# Patient Record
Sex: Female | Born: 1975 | Race: White | Hispanic: No | Marital: Married | Smoking: Never smoker
Health system: Southern US, Community
[De-identification: ages and names within clinical notes are randomized; demographics above are authoritative.]

## PROBLEM LIST (undated history)

## (undated) DIAGNOSIS — N289 Disorder of kidney and ureter, unspecified: Secondary | ICD-10-CM

## (undated) DIAGNOSIS — F329 Major depressive disorder, single episode, unspecified: Secondary | ICD-10-CM

## (undated) DIAGNOSIS — Z87442 Personal history of urinary calculi: Secondary | ICD-10-CM

## (undated) DIAGNOSIS — N201 Calculus of ureter: Secondary | ICD-10-CM

## (undated) DIAGNOSIS — Z8719 Personal history of other diseases of the digestive system: Secondary | ICD-10-CM

## (undated) DIAGNOSIS — G4733 Obstructive sleep apnea (adult) (pediatric): Secondary | ICD-10-CM

## (undated) DIAGNOSIS — N200 Calculus of kidney: Secondary | ICD-10-CM

## (undated) DIAGNOSIS — G540 Brachial plexus disorders: Secondary | ICD-10-CM

## (undated) DIAGNOSIS — I82409 Acute embolism and thrombosis of unspecified deep veins of unspecified lower extremity: Secondary | ICD-10-CM

## (undated) DIAGNOSIS — F411 Generalized anxiety disorder: Secondary | ICD-10-CM

## (undated) DIAGNOSIS — F32A Depression, unspecified: Secondary | ICD-10-CM

## (undated) HISTORY — PX: TUBAL LIGATION: SHX77

## (undated) HISTORY — PX: BUNIONECTOMY: SHX129

## (undated) HISTORY — PX: SPINAL FUSION: SHX223

## (undated) HISTORY — PX: BACK SURGERY: SHX140

## (undated) HISTORY — PX: CHOLECYSTECTOMY: SHX55

---
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
## 2002-04-23 HISTORY — PX: TUBAL LIGATION: SHX77

## 2004-04-23 HISTORY — PX: NASAL SEPTUM SURGERY: SHX37

## 2007-12-24 DIAGNOSIS — R002 Palpitations: Secondary | ICD-10-CM | POA: Insufficient documentation

## 2007-12-24 DIAGNOSIS — R079 Chest pain, unspecified: Secondary | ICD-10-CM | POA: Insufficient documentation

## 2007-12-24 DIAGNOSIS — R011 Cardiac murmur, unspecified: Secondary | ICD-10-CM | POA: Insufficient documentation

## 2011-04-24 HISTORY — PX: URETEROSCOPY WITH HOLMIUM LASER LITHOTRIPSY: SHX6645

## 2015-07-23 HISTORY — PX: LAPAROSCOPIC CHOLECYSTECTOMY: SUR755

## 2015-10-19 DIAGNOSIS — G4733 Obstructive sleep apnea (adult) (pediatric): Secondary | ICD-10-CM | POA: Insufficient documentation

## 2015-11-22 DIAGNOSIS — Z86718 Personal history of other venous thrombosis and embolism: Secondary | ICD-10-CM

## 2015-11-22 HISTORY — DX: Personal history of other venous thrombosis and embolism: Z86.718

## 2015-12-08 DIAGNOSIS — Q66212 Congenital metatarsus primus varus, left foot: Secondary | ICD-10-CM | POA: Insufficient documentation

## 2015-12-08 HISTORY — PX: BUNIONECTOMY: SHX129

## 2016-10-05 HISTORY — PX: LUMBAR DISC SURGERY: SHX700

## 2017-04-04 DIAGNOSIS — N200 Calculus of kidney: Secondary | ICD-10-CM | POA: Insufficient documentation

## 2017-07-04 DIAGNOSIS — G43109 Migraine with aura, not intractable, without status migrainosus: Secondary | ICD-10-CM | POA: Insufficient documentation

## 2017-11-16 DIAGNOSIS — G8929 Other chronic pain: Secondary | ICD-10-CM | POA: Insufficient documentation

## 2017-12-04 DIAGNOSIS — Z981 Arthrodesis status: Secondary | ICD-10-CM | POA: Insufficient documentation

## 2018-01-24 HISTORY — PX: LUMBAR FUSION: SHX111

## 2018-10-15 DIAGNOSIS — F411 Generalized anxiety disorder: Secondary | ICD-10-CM | POA: Insufficient documentation

## 2018-10-15 DIAGNOSIS — F331 Major depressive disorder, recurrent, moderate: Secondary | ICD-10-CM | POA: Insufficient documentation

## 2018-10-31 ENCOUNTER — Emergency Department
Admission: EM | Admit: 2018-10-31 | Discharge: 2018-10-31 | Disposition: A | Discharge status: Home / Self Care | Payer: Managed Care, Other (non HMO) | Source: Home / Self Care

## 2018-10-31 ENCOUNTER — Other Ambulatory Visit: Payer: Self-pay

## 2018-10-31 DIAGNOSIS — R0982 Postnasal drip: Secondary | ICD-10-CM

## 2018-10-31 DIAGNOSIS — R0981 Nasal congestion: Secondary | ICD-10-CM | POA: Diagnosis not present

## 2018-10-31 DIAGNOSIS — H9201 Otalgia, right ear: Secondary | ICD-10-CM

## 2018-10-31 MED ORDER — IPRATROPIUM BROMIDE 0.06 % NA SOLN: 2.0000 | Freq: Four times a day (QID) | NASAL | 1 refills | Status: DC

## 2018-10-31 NOTE — ED Triage Notes (Signed)
Pt states that nasal congestion and scratchy throat started Monday, ear ache started Wednesday.  Low grade fever.

## 2018-10-31 NOTE — ED Provider Notes (Signed)
[facility]    CSN: [id number] Arrival date & time: 10/31/18  [id number]     History   Chief Complaint Chief Complaint  Patient presents with  . Otalgia  . Nasal Congestion  . Cough  . Sore Throat    HPI Beverly Ford is a 43 y.o. female.   HPI Beverly Ford is a 43 y.o. female presenting to UC with c/o nasal congestion and scratchy throat that started 5 days ago, associated Right ear pain/fullness that started 2 days ago. She did go to a chiropractor yesterday for a "release" which provided moderate relief but still having some symptoms. She has tried OTC mucinex and allergy medication w/o relief.  She does not take allergy medication daily, only when symptomatic. Denies fever, chills, n/v/d. Denies known sick contacts or recent travel.    Past Medical History:  Diagnosis Date  . Depression   . DVT (deep venous thrombosis) (HCC)    after surgery  . Renal disorder    hx kidney stones    There are no active problems to display for this patient.   Past Surgical History:  Procedure Laterality Date  . BACK SURGERY    . BUNIONECTOMY    . CHOLECYSTECTOMY    . SPINAL FUSION    . TUBAL LIGATION      OB History   No obstetric history on file.      Home Medications    Prior to Admission medications   Medication Sig Start Date End Date Taking? Authorizing Provider  gabapentin (NEURONTIN) 300 MG capsule TAKE ONE CAPSULE BY MOUTH TWICE A DAY 10/07/18  Yes [provider]  hydrochlorothiazide (HYDRODIURIL) 12.5 MG tablet Take by mouth. 05/21/18 05/21/19 Yes [provider]  potassium citrate (UROCIT-K) 10 MEQ (1080 MG) SR tablet Take by mouth. 11/18/17 11/18/18 Yes [provider]  sertraline (ZOLOFT) 50 MG tablet Take 50 mg by mouth daily.   Yes [provider]  ipratropium (ATROVENT) 0.06 % nasal spray Place 2 sprays into both nostrils 4 (four) times daily. 10/31/18   Lurene ShadowPhelps, Jaan Fischel O, PA-C    Family History History reviewed. No  pertinent family history.  Social History Social History   Tobacco Use  . Smoking status: Never Smoker  . Smokeless tobacco: Never Used  Substance Use Topics  . Alcohol use: Yes    Comment: 1/week  . Drug use: Not Currently     Allergies   Iodine and Shellfish allergy   Review of Systems Review of Systems  Constitutional: Negative for chills and fever.  HENT: Positive for congestion ( minimal), ear pain (Right) and postnasal drip. Negative for sore throat, trouble swallowing and voice change.   Respiratory: Positive for cough ( minimal). Negative for shortness of breath.   Cardiovascular: Negative for chest pain and palpitations.  Gastrointestinal: Negative for abdominal pain, diarrhea, nausea and vomiting.  Musculoskeletal: Negative for arthralgias, back pain and myalgias.  Skin: Negative for rash.  Neurological: Negative for dizziness, light-headedness and headaches.     Physical Exam Triage Vital Signs ED Triage Vitals  Enc Vitals Group     BP 10/31/18 0940 106/72     Pulse Rate 10/31/18 0940 84     Resp 10/31/18 0940 20     Temp 10/31/18 0940 98.3 F (36.8 C)     Temp Source 10/31/18 0940 Oral     SpO2 10/31/18 0940 97 %     Weight 10/31/18 0943 150 lb (68 kg)     Height 10/31/18  [id number] 5\' 3"  (1.6 m)     Head Circumference --      Peak Flow --      Pain Score 10/31/18 0942 0     Pain Loc --      Pain Edu? --      Excl. in GC? --    No data found.  Updated Vital Signs BP 106/72 (BP Location: Right Arm)   Pulse 84   Temp 98.3 F (36.8 C) (Oral)   Resp 20   Ht 5\' 3"  (1.6 m)   Wt 150 lb (68 kg)   LMP 10/10/2018   SpO2 97%   BMI 26.57 kg/m   Visual Acuity Right Eye Distance:   Left Eye Distance:   Bilateral Distance:    Right Eye Near:   Left Eye Near:    Bilateral Near:     Physical Exam Vitals signs and nursing note reviewed.  Constitutional:      Appearance: She is well-developed.  HENT:     Head: Normocephalic and atraumatic.      Right Ear: Tympanic membrane normal.     Left Ear: Tympanic membrane normal.     Nose: Mucosal edema present.     Right Sinus: No maxillary sinus tenderness or frontal sinus tenderness.     Left Sinus: No maxillary sinus tenderness or frontal sinus tenderness.     Mouth/Throat:     Lips: Pink.     Mouth: Mucous membranes are moist.     Pharynx: Oropharynx is clear. Uvula midline.  Neck:     Musculoskeletal: Normal range of motion.  Cardiovascular:     Rate and Rhythm: Normal rate and regular rhythm.  Pulmonary:     Effort: Pulmonary effort is normal. No respiratory distress.     Breath sounds: Normal breath sounds. No stridor. No wheezing, rhonchi or rales.  Musculoskeletal: Normal range of motion.  Skin:    General: Skin is warm and dry.  Neurological:     Mental Status: She is alert and oriented to person, place, and time.  Psychiatric:        Behavior: Behavior normal.      UC Treatments / Results  Labs (all labs ordered are listed, but only abnormal results are displayed) Labs Reviewed - No data to display  EKG   Radiology No results found.  Procedures Procedures (including critical care time)  Medications Ordered in UC Medications - No data to display  Initial Impression / Assessment and Plan / UC Course  I have reviewed the triage vital signs and the nursing notes.  Pertinent labs & imaging results that were available during my care of the patient were reviewed by me and considered in my medical decision making (see chart for details).     Hx and exam most c/w allergic rhinitis. No evidence of bacterial infection at this time. Encouraged symptomatic tx  Final Clinical Impressions(s) / UC Diagnoses   Final diagnoses:  Sinus congestion  Right ear pain  Post-nasal drip     Discharge Instructions      Please use the nasal spray as prescribed to help with post-nasal drip. You may use up to 4 times daily, however, if it is drying you out too much,  you may use once or twice daily.  You may also try over the counter sinus rinses.  Please follow up with family medicine in 1 week if not improving, especially if symptoms worsening.     ED Prescriptions    Medication  Sig Dispense Auth. Provider   ipratropium (ATROVENT) 0.06 % nasal spray Place 2 sprays into both nostrils 4 (four) times daily. 15 mL Noe Gens, PA-C     Controlled Substance Prescriptions [place] Controlled Substance Registry consulted? Not Applicable   Tyrell Antonio 10/31/18 1236

## 2018-10-31 NOTE — Discharge Instructions (Signed)
°  Please use the nasal spray as prescribed to help with post-nasal drip. You may use up to 4 times daily, however, if it is drying you out too much, you may use once or twice daily.  You may also try over the counter sinus rinses.  Please follow up with family medicine in 1 week if not improving, especially if symptoms worsening.

## 2019-03-24 ENCOUNTER — Telehealth: Payer: Managed Care, Other (non HMO) | Admitting: Physician Assistant

## 2019-03-24 DIAGNOSIS — R05 Cough: Secondary | ICD-10-CM

## 2019-03-24 DIAGNOSIS — R059 Cough, unspecified: Secondary | ICD-10-CM

## 2019-03-24 MED ORDER — PROMETHAZINE-DM 6.25-15 MG/5ML PO SYRP: 5.0000 mL | ORAL_SOLUTION | Freq: Four times a day (QID) | ORAL | 0 refills | Status: DC | PRN

## 2019-03-24 MED ORDER — BENZONATATE 100 MG PO CAPS: 100.0000 mg | ORAL_CAPSULE | Freq: Three times a day (TID) | ORAL | 0 refills | Status: DC | PRN

## 2019-03-24 NOTE — Progress Notes (Signed)
E-Visit for Corona Virus Screening   Your current symptoms could be consistent with the coronavirus.  Many health care providers can now test patients at their office but not all are.  [facility] has multiple testing sites  - you do not need an appointment or a  Lab order. For information on our COVID testing locations and hours go to [email or web address]  Please quarantine yourself while awaiting your test results.    We are enrolling you in our [facility] for [place] . Daily you will receive a questionnaire within the [facility] website. Our COVID 19 response team willl be monitoriing your responses daily. Please continue good preventive care measures, including:  frequent hand-washing, avoid touching your face, cover coughs/sneezes, stay out of crowds and keep a 6 foot distance from others.    COVID-19 is a respiratory illness with symptoms that are similar to the flu. Symptoms are typically mild to moderate, but there have been cases of severe illness and death due to the virus. The following symptoms may appear 2-14 days after exposure: . Fever . Cough . Shortness of breath or difficulty breathing . Chills . Repeated shaking with chills . Muscle pain . Headache . Sore throat . New loss of taste or smell . Fatigue . Congestion or runny nose . Nausea or vomiting . Diarrhea  If you develop fever/cough/breathlessness, please stay home for 10 days with improving symptoms and until you have had 24 hours of no fever (without taking a fever reducer).  Go to the nearest hospital ED for assessment if fever/cough/breathlessness are severe or illness seems like a threat to life.  It is vitally important that if you feel that you have an infection such as this virus or any other virus that you stay home and away from places where you may spread it to others.  You should avoid contact with people age 44 and older.   You should wear a mask or cloth face covering  over your nose and mouth if you must be around other people or animals, including pets (even at home). Try to stay at least 6 feet away from other people. This will protect the people around you.  You can use medication such as A prescription cough medication called Tessalon Perles 100 mg. You may take 1-2 capsules every 8 hours as needed for cough and A prescription cough medication called Phenergan DM 6.25 mg/15 mg. You make take one teaspoon / 5 ml every 4-6 hours as needed for cough  You may also take acetaminophen (Tylenol) as needed for fever.   Reduce your risk of any infection by using the same precautions used for avoiding the common cold or flu:  Marland Kitchen Wash your hands often with soap and warm water for at least 20 seconds.  If soap and water are not readily available, use an alcohol-based hand sanitizer with at least 60% alcohol.  . If coughing or sneezing, cover your mouth and nose by coughing or sneezing into the elbow areas of your shirt or coat, into a tissue or into your sleeve (not your hands). . Avoid shaking hands with others and consider head nods or verbal greetings only. . Avoid touching your eyes, nose, or mouth with unwashed hands.  . Avoid close contact with people who are sick. . Avoid places or events with large numbers of people in one location, like concerts or sporting events. . Carefully consider travel plans you have or are making. . If you are planning any travel  outside or inside the [place], visit the CDC's Travelers' Health webpage for the latest health notices. . If you have some symptoms but not all symptoms, continue to monitor at home and seek medical attention if your symptoms worsen. . If you are having a medical emergency, call 911.  HOME CARE . Only take medications as instructed by your medical team. . Drink plenty of fluids and get plenty of rest. . A steam or ultrasonic humidifier can help if you have congestion.   GET HELP RIGHT AWAY IF YOU HAVE EMERGENCY  WARNING SIGNS** FOR COVID-19. If you or someone is showing any of these signs seek emergency medical care immediately. Call 911 or proceed to your closest emergency facility if: . You develop worsening high fever. . Trouble breathing . Bluish lips or face . Persistent pain or pressure in the chest . New confusion . Inability to wake or stay awake . You cough up blood. . Your symptoms become more severe  **This list is not all possible symptoms. Contact your medical provider for any symptoms that are sever or concerning to you.   MAKE SURE YOU   Understand these instructions.  Will watch your condition.  Will get help right away if you are not doing well or get worse.  Your e-visit answers were reviewed by a board certified advanced clinical practitioner to complete your personal care plan.  Depending on the condition, your plan could have included both over the counter or prescription medications.  If there is a problem please reply once you have received a response from your provider.  Your safety is important to [place].  If you have drug allergies check your prescription carefully.    You can use MyChart to ask questions about today's visit, request a non-urgent call back, or ask for a work or school excuse for 24 hours related to this e-Visit. If it has been greater than 24 hours you will need to follow up with your provider, or enter a new e-Visit to address those concerns. You will get an e-mail in the next two days asking about your experience.  I hope that your e-visit has been valuable and will speed your recovery. Thank you for using e-visits.   Greater than 5 minutes, yet less than 10 minutes of time have been spent researching, coordinating and implementing care for this patient today.

## 2019-04-16 ENCOUNTER — Emergency Department
Admission: EM | Admit: 2019-04-16 | Discharge: 2019-04-16 | Disposition: A | Discharge status: Home / Self Care | Payer: Managed Care, Other (non HMO) | Source: Home / Self Care | Attending: Family Medicine | Admitting: Family Medicine

## 2019-04-16 ENCOUNTER — Other Ambulatory Visit: Payer: Self-pay

## 2019-04-16 DIAGNOSIS — R109 Unspecified abdominal pain: Secondary | ICD-10-CM | POA: Diagnosis not present

## 2019-04-16 LAB — POCT URINALYSIS DIP (MANUAL ENTRY)
Bilirubin, UA: NEGATIVE
Glucose, UA: NEGATIVE mg/dL
Ketones, POC UA: NEGATIVE mg/dL
Leukocytes, UA: NEGATIVE
Nitrite, UA: NEGATIVE
Protein Ur, POC: NEGATIVE mg/dL
Spec Grav, UA: 1.025 (ref 1.010–1.025)
Urobilinogen, UA: 0.2 E.U./dL
pH, UA: 7 (ref 5.0–8.0)

## 2019-04-16 MED ORDER — CEPHALEXIN 500 MG PO CAPS: 500.0000 mg | ORAL_CAPSULE | Freq: Two times a day (BID) | ORAL | 0 refills | Status: DC

## 2019-04-16 MED ORDER — TAMSULOSIN HCL 0.4 MG PO CAPS: 0.4000 mg | ORAL_CAPSULE | Freq: Every day | ORAL | 0 refills | Status: DC

## 2019-04-16 NOTE — Discharge Instructions (Addendum)
Increase fluid intake.  May take Ibuprofen 200mg, 4 tabs every 8 hours with food.  °

## 2019-04-16 NOTE — ED Triage Notes (Signed)
Pt c/o adb and back pain x 1 week. Hx of kidney stones. Also having a urinary urgency/frequency. Pain 3/10

## 2019-04-16 NOTE — ED Provider Notes (Signed)
[facility]    CSN: [id number] Arrival date & time: 04/16/19  [id number]      History   Chief Complaint Chief Complaint  Patient presents with  . Abdominal Pain  . Back Pain    HPI Beverly Ford is a 43 y.o. female.   Patient complains of right side abdominal/flank pain for one week with urinary urgency/frequency.  She denies hematuria, fever, and nausea/vomiting. Patient's last menstrual period was 04/07/2019 (exact date).  She has a history of nephrolithiasis, followed by urologist Dr. Fransico SettersJorge Gutierrez-Aceves.  She takes HCTZ 12.5mg  daily, and has been prescribed potassium citrate daily but is not taking now.   Flank Pain This is a recurrent problem. The current episode started more than 1 week ago. The problem occurs constantly. The problem has not changed since onset.Associated symptoms include abdominal pain. Nothing aggravates the symptoms. Nothing relieves the symptoms. She has tried nothing for the symptoms.    Past Medical History:  Diagnosis Date  . Depression   . DVT (deep venous thrombosis) (HCC)    after surgery  . Renal disorder    hx kidney stones    There are no problems to display for this patient.   Past Surgical History:  Procedure Laterality Date  . BACK SURGERY    . BUNIONECTOMY    . CHOLECYSTECTOMY    . SPINAL FUSION    . TUBAL LIGATION      OB History   No obstetric history on file.      Home Medications    Prior to Admission medications   Medication Sig Start Date End Date Taking? Authorizing Provider  benzonatate (TESSALON) 100 MG capsule Take 1-2 capsules (100-200 mg total) by mouth 3 (three) times daily as needed for cough. 03/24/19   McVey, Madelaine BhatElizabeth Whitney, PA-C  cephALEXin (KEFLEX) 500 MG capsule Take 1 capsule (500 mg total) by mouth 2 (two) times daily. 04/16/19   Lattie HawBeese, Hollace Michelli A, MD  gabapentin (NEURONTIN) 300 MG capsule TAKE ONE CAPSULE BY MOUTH TWICE A DAY 10/07/18   [provider]  hydrochlorothiazide  (HYDRODIURIL) 12.5 MG tablet Take by mouth. 05/21/18 05/21/19  [provider]  ipratropium (ATROVENT) 0.06 % nasal spray Place 2 sprays into both nostrils 4 (four) times daily. 10/31/18   Lurene ShadowPhelps, Erin O, PA-C  promethazine-dextromethorphan (PROMETHAZINE-DM) 6.25-15 MG/5ML syrup Take 5 mLs by mouth 4 (four) times daily as needed for cough. 03/24/19   McVey, Madelaine BhatElizabeth Whitney, PA-C  sertraline (ZOLOFT) 50 MG tablet Take 100 mg by mouth daily.     [provider]  tamsulosin (FLOMAX) 0.4 MG CAPS capsule Take 1 capsule (0.4 mg total) by mouth daily after supper. 04/16/19   Lattie HawBeese, Brittne Kawasaki A, MD    Family History History reviewed. No pertinent family history.  Social History Social History   Tobacco Use  . Smoking status: Never Smoker  . Smokeless tobacco: Never Used  Substance Use Topics  . Alcohol use: Yes    Comment: 1/week  . Drug use: Not Currently     Allergies   Iodine and Shellfish allergy   Review of Systems Review of Systems  Constitutional: Negative for activity change, appetite change, chills, diaphoresis, fatigue and fever.  Gastrointestinal: Positive for abdominal pain. Negative for nausea and vomiting.  Genitourinary: Positive for flank pain, frequency and urgency. Negative for dysuria, hematuria, pelvic pain, vaginal bleeding and vaginal discharge.  All other systems reviewed and are negative.    Physical Exam Triage Vital Signs ED Triage Vitals  Enc  Vitals Group     BP 04/16/19 0846 113/72     Pulse --      Resp 04/16/19 0846 18     Temp 04/16/19 0846 (!) 97.5 F (36.4 C)     Temp Source 04/16/19 0846 Oral     SpO2 04/16/19 0846 98 %     Weight 04/16/19 0847 150 lb (68 kg)     Height 04/16/19 0847 5\' 3"  (1.6 m)     Head Circumference --      Peak Flow --      Pain Score 04/16/19 0847 3     Pain Loc --      Pain Edu? --      Excl. in GC? --    No data found.  Updated Vital Signs BP 113/72 (BP Location: Right Arm)   Temp (!) 97.5 F  (36.4 C) (Oral)   Resp 18   Ht 5\' 3"  (1.6 m)   Wt 68 kg   LMP 04/07/2019 (Exact Date)   SpO2 98%   BMI 26.57 kg/m   Visual Acuity Right Eye Distance:   Left Eye Distance:   Bilateral Distance:    Right Eye Near:   Left Eye Near:    Bilateral Near:     Physical Exam Nursing notes and Vital Signs reviewed. Appearance:  Patient appears stated age, and in no acute distress.    Eyes:  Pupils are equal, round, and reactive to light and accomodation.  Extraocular movement is intact.  Conjunctivae are not inflamed   Pharynx:  Normal; moist mucous membranes  Neck:  Supple.  No adenopathy Lungs:  Clear to auscultation.  Breath sounds are equal.  Moving air well. Heart:  Regular rate and rhythm without murmurs, rubs, or gallops.  Abdomen:  Nontender without masses or hepatosplenomegaly.  Bowel sounds are present.  No CVA or flank tenderness.  Extremities:  No edema.  Skin:  No rash present.     UC Treatments / Results  Labs (all labs ordered are listed, but only abnormal results are displayed) Labs Reviewed  POCT URINALYSIS DIP (MANUAL ENTRY) - Abnormal; Notable for the following components:      Result Value   Blood, UA trace-intact (*)    All other components within normal limits  URINE CULTURE    EKG   Radiology No results found.  Procedures Procedures (including critical care time)  Medications Ordered in UC Medications - No data to display  Initial Impression / Assessment and Plan / UC Course  I have reviewed the triage vital signs and the nursing notes.  Pertinent labs & imaging results that were available during my care of the patient were reviewed by me and considered in my medical decision making (see chart for details).    ?recurrent urolithiasis.  Will begin Flomax. Urine culture pending.  Will begin empiric Keflex. If symptoms become significantly worse during the night or over the weekend, proceed to the local emergency room.  Recommend follow-up with  her urologist   Final Clinical Impressions(s) / UC Diagnoses   Final diagnoses:  Acute right flank pain     Discharge Instructions     Increase fluid intake.  May take Ibuprofen 200mg , 4 tabs every 8 hours with food.     ED Prescriptions    Medication Sig Dispense Auth. Provider   cephALEXin (KEFLEX) 500 MG capsule Take 1 capsule (500 mg total) by mouth 2 (two) times daily. 14 capsule , MD   tamsulosin (  FLOMAX) 0.4 MG CAPS capsule Take 1 capsule (0.4 mg total) by mouth daily after supper. 12 capsule Kandra Nicolas, MD        Kandra Nicolas, MD 04/23/19 [phone]

## 2019-04-17 LAB — URINE CULTURE
MICRO NUMBER:: 1230346
Result:: NO GROWTH
SPECIMEN QUALITY:: ADEQUATE

## 2019-04-27 ENCOUNTER — Emergency Department (INDEPENDENT_AMBULATORY_CARE_PROVIDER_SITE_OTHER)
Admission: EM | Admit: 2019-04-27 | Discharge: 2019-04-27 | Disposition: A | Discharge status: Home / Self Care | Payer: Managed Care, Other (non HMO) | Source: Home / Self Care

## 2019-04-27 ENCOUNTER — Other Ambulatory Visit: Payer: Self-pay

## 2019-04-27 DIAGNOSIS — R509 Fever, unspecified: Secondary | ICD-10-CM | POA: Diagnosis not present

## 2019-04-27 DIAGNOSIS — Z20822 Contact with and (suspected) exposure to covid-19: Secondary | ICD-10-CM

## 2019-04-27 DIAGNOSIS — B349 Viral infection, unspecified: Secondary | ICD-10-CM

## 2019-04-27 MED ORDER — IPRATROPIUM BROMIDE 0.06 % NA SOLN: 2.0000 | Freq: Three times a day (TID) | NASAL | 1 refills | Status: DC

## 2019-04-27 MED ORDER — ALBUTEROL SULFATE HFA 108 (90 BASE) MCG/ACT IN AERS: 1.0000 | INHALATION_SPRAY | Freq: Four times a day (QID) | RESPIRATORY_TRACT | 0 refills | Status: DC | PRN

## 2019-04-27 MED ORDER — BENZONATATE 100 MG PO CAPS: 100.0000 mg | ORAL_CAPSULE | Freq: Three times a day (TID) | ORAL | 0 refills | Status: DC | PRN

## 2019-04-27 MED ORDER — LEVOCETIRIZINE DIHYDROCHLORIDE 5 MG PO TABS: 5.0000 mg | ORAL_TABLET | Freq: Every evening | ORAL | 0 refills | Status: DC

## 2019-04-27 NOTE — ED Provider Notes (Addendum)
[facility]    CSN: [id number] Arrival date & time: 04/27/19  1023      History   Chief Complaint Chief Complaint  Patient presents with  . Fever  . Chest Pain    when breathing    HPI Beverly Ford is a 44 y.o. female.   HPI  Beverly Ford presents for evaluation of fever, chest pain, cough and fever. Patient concern for COVID-19. Reports fever x 1 day. She also have experienced non-productive cough. Has taken OTC medication yesterday to management symptoms. Endorses chest tenderness with breathing and some mild dyspnea.No history of chronic respiratory disease. No known household sick contacts. Past Medical History:  Diagnosis Date  . Depression   . DVT (deep venous thrombosis) (HCC)    after surgery  . Renal disorder    hx kidney stones    There are no problems to display for this patient.   Past Surgical History:  Procedure Laterality Date  . BACK SURGERY    . BUNIONECTOMY    . CHOLECYSTECTOMY    . SPINAL FUSION    . TUBAL LIGATION      OB History   No obstetric history on file.      Home Medications    Prior to Admission medications   Medication Sig Start Date End Date Taking? Authorizing Provider  cephALEXin (KEFLEX) 500 MG capsule Take 1 capsule (500 mg total) by mouth 2 (two) times daily. 04/16/19  Yes Lattie Haw, MD  traZODone (DESYREL) 50 MG tablet Take by mouth. 04/09/19 05/09/19 Yes [provider]  benzonatate (TESSALON) 100 MG capsule Take 1-2 capsules (100-200 mg total) by mouth 3 (three) times daily as needed for cough. 03/24/19   McVey, Madelaine Bhat, PA-C  gabapentin (NEURONTIN) 300 MG capsule TAKE ONE CAPSULE BY MOUTH TWICE A DAY 10/07/18   [provider]  hydrochlorothiazide (HYDRODIURIL) 12.5 MG tablet Take by mouth. 05/21/18 05/21/19  [provider]  ipratropium (ATROVENT) 0.06 % nasal spray Place 2 sprays into both nostrils 4 (four) times daily. 10/31/18   Lurene Shadow, PA-C    promethazine-dextromethorphan (PROMETHAZINE-DM) 6.25-15 MG/5ML syrup Take 5 mLs by mouth 4 (four) times daily as needed for cough. 03/24/19   McVey, Madelaine Bhat, PA-C  sertraline (ZOLOFT) 50 MG tablet Take 100 mg by mouth daily.     [provider]  tamsulosin (FLOMAX) 0.4 MG CAPS capsule Take 1 capsule (0.4 mg total) by mouth daily after supper. 04/16/19   Lattie Haw, MD    Family History History reviewed. No pertinent family history.  Social History Social History   Tobacco Use  . Smoking status: Never Smoker  . Smokeless tobacco: Never Used  Substance Use Topics  . Alcohol use: Yes    Comment: 1/week  . Drug use: Not Currently     Allergies   Iodine and Shellfish allergy   Review of Systems Review of Systems Pertinent negatives listed in HPI Physical Exam Triage Vital Signs ED Triage Vitals  Enc Vitals Group     BP 04/27/19 1216 122/78     Pulse Rate 04/27/19 1216 74     Resp 04/27/19 1216 20     Temp 04/27/19 1216 98.1 F (36.7 C)     Temp Source 04/27/19 1216 Oral     SpO2 04/27/19 1216 98 %     Weight 04/27/19 1217 150 lb (68 kg)     Height 04/27/19 1217 5\' 3"  (1.6 m)     Head Circumference --  Peak Flow --      Pain Score 04/27/19 1217 2     Pain Loc --      Pain Edu? --      Excl. in GC? --    No data found.  Updated Vital Signs BP 122/78 (BP Location: Right Arm)   Pulse 74   Temp 98.1 F (36.7 C) (Oral)   Resp 20   Ht 5\' 3"  (1.6 m)   Wt 150 lb (68 kg)   LMP 04/07/2019 (Exact Date)   SpO2 98%   BMI 26.57 kg/m   Visual Acuity Right Eye Distance:   Left Eye Distance:   Bilateral Distance:    Right Eye Near:   Left Eye Near:    Bilateral Near:     Physical Exam Constitutional:      Appearance: She is not ill-appearing.  HENT:     Head: Normocephalic.     Nose: Congestion present. No nasal deformity, signs of injury or nasal tenderness.     Right Turbinates: Enlarged and swollen.     Left Turbinates: Enlarged  and swollen.  Eyes:     Extraocular Movements: Extraocular movements intact.     Pupils: Pupils are equal, round, and reactive to light.  Cardiovascular:     Rate and Rhythm: Normal rate and regular rhythm.  Pulmonary:     Breath sounds: No decreased breath sounds, wheezing, rhonchi or rales.  Lymphadenopathy:     Cervical: No cervical adenopathy.  Skin:    General: Skin is warm.  Neurological:     Mental Status: She is alert.  Psychiatric:        Mood and Affect: Mood normal.      UC Treatments / Results  Labs (all labs ordered are listed, but only abnormal results are displayed) Labs Reviewed  NOVEL CORONAVIRUS, NAA    EKG   Radiology No results found.  Procedures Procedures (including critical care time)  Medications Ordered in UC Medications - No data to display  Initial Impression / Assessment and Plan / UC Course  I have reviewed the triage vital signs and the nursing notes.  Pertinent labs & imaging results that were available during my care of the patient were reviewed by me and considered in my medical decision making (see chart for details).   Encounter for COVID-19 testing without known exposure.  Patient had complained of fever as a chest tightness with inhalation.  No known recent history of asthma or COPD.  Exam findings were significant for some nasal congestion will trial levocetirizine along with ipratropium on for management of those symptoms.  Patient is not having a productive cough.,  Other coughs occasionally-take benzonatate 100 to 200 mg 3 times daily as needed for occasional cough.  Presents with chest tightness with breathing we will trial an albuterol inhaler 2 puffs every 4-6 hours as needed to improve work of breathing.  If symptoms worsen or do not improve return for follow-up.  Patient advised that all negative Covid test results will be sent directly to my chart.  All positive COVID-19 test will be communicated via phone.  Patient verbalized  understanding and agreement with plan. Final Clinical Impressions(s) / UC Diagnoses   Final diagnoses:  Fever, unspecified  Encounter for laboratory testing for COVID-19 virus  Viral illness     Discharge Instructions     All negative results will be sent directly to your MyChart.  A staff member from our clinic will contact you if your results  are positive.  I have provided you with a work note today for 72 hours in order to allow time for your COVID-19 test results to be available.    ED Prescriptions    Medication Sig Dispense Auth. Provider   benzonatate (TESSALON) 100 MG capsule Take 1-2 capsules (100-200 mg total) by mouth 3 (three) times daily as needed for cough. 40 capsule Scot Jun, FNP   levocetirizine (XYZAL) 5 MG tablet Take 1 tablet (5 mg total) by mouth every evening. 30 tablet Scot Jun, FNP   ipratropium (ATROVENT) 0.06 % nasal spray Place 2 sprays into both nostrils 3 (three) times daily. 15 mL Scot Jun, FNP   albuterol (VENTOLIN HFA) 108 (90 Base) MCG/ACT inhaler Inhale 1-2 puffs into the lungs every 6 (six) hours as needed for wheezing or shortness of breath. 8 g Scot Jun, FNP     PDMP not reviewed this encounter.   Scot Jun, FNP 04/29/19 1343    Scot Jun, FNP 04/29/19 1347

## 2019-04-27 NOTE — ED Triage Notes (Signed)
Saturday started running a low grade fever in the 99s.  Yesterday as high as 100.6.  Also hears "gurgling" when breathing, and chest pain when breathing in.

## 2019-04-27 NOTE — Discharge Instructions (Signed)
All negative results will be sent directly to your MyChart.  A staff member from our clinic will contact you if your results are positive.  I have provided you with a work note today for 72 hours in order to allow time for your COVID-19 test results to be available.

## 2019-04-29 LAB — NOVEL CORONAVIRUS, NAA: SARS-CoV-2, NAA: NOT DETECTED

## 2019-09-28 DIAGNOSIS — M546 Pain in thoracic spine: Secondary | ICD-10-CM | POA: Insufficient documentation

## 2019-09-28 DIAGNOSIS — M5412 Radiculopathy, cervical region: Secondary | ICD-10-CM | POA: Insufficient documentation

## 2020-03-02 ENCOUNTER — Ambulatory Visit: Payer: Managed Care, Other (non HMO) | Admitting: Podiatry

## 2020-03-03 ENCOUNTER — Emergency Department (HOSPITAL_BASED_OUTPATIENT_CLINIC_OR_DEPARTMENT_OTHER): Payer: Managed Care, Other (non HMO)

## 2020-03-03 ENCOUNTER — Encounter (HOSPITAL_BASED_OUTPATIENT_CLINIC_OR_DEPARTMENT_OTHER): Payer: Self-pay | Admitting: *Deleted

## 2020-03-03 ENCOUNTER — Other Ambulatory Visit: Payer: Self-pay

## 2020-03-03 ENCOUNTER — Emergency Department (HOSPITAL_BASED_OUTPATIENT_CLINIC_OR_DEPARTMENT_OTHER)
Admission: EM | Admit: 2020-03-03 | Discharge: 2020-03-03 | Disposition: A | Discharge status: Home / Self Care | Payer: Managed Care, Other (non HMO) | Attending: Emergency Medicine | Admitting: Emergency Medicine

## 2020-03-03 DIAGNOSIS — Z9851 Tubal ligation status: Secondary | ICD-10-CM | POA: Insufficient documentation

## 2020-03-03 DIAGNOSIS — N200 Calculus of kidney: Secondary | ICD-10-CM | POA: Insufficient documentation

## 2020-03-03 DIAGNOSIS — R1032 Left lower quadrant pain: Secondary | ICD-10-CM | POA: Diagnosis present

## 2020-03-03 DIAGNOSIS — Z87442 Personal history of urinary calculi: Secondary | ICD-10-CM | POA: Diagnosis not present

## 2020-03-03 LAB — CBC
HCT: 38.9 % (ref 36.0–46.0)
Hemoglobin: 12.6 g/dL (ref 12.0–15.0)
MCH: 27 pg (ref 26.0–34.0)
MCHC: 32.4 g/dL (ref 30.0–36.0)
MCV: 83.5 fL (ref 80.0–100.0)
Platelets: 280 10*3/uL (ref 150–400)
RBC: 4.66 MIL/uL (ref 3.87–5.11)
RDW: 13.8 % (ref 11.5–15.5)
WBC: 8.2 10*3/uL (ref 4.0–10.5)
nRBC: 0 % (ref 0.0–0.2)

## 2020-03-03 LAB — URINALYSIS, ROUTINE W REFLEX MICROSCOPIC
Bilirubin Urine: NEGATIVE
Glucose, UA: NEGATIVE mg/dL
Hgb urine dipstick: NEGATIVE
Ketones, ur: NEGATIVE mg/dL
Nitrite: NEGATIVE
Protein, ur: NEGATIVE mg/dL
Specific Gravity, Urine: 1.02 (ref 1.005–1.030)
pH: 6 (ref 5.0–8.0)

## 2020-03-03 LAB — URINALYSIS, MICROSCOPIC (REFLEX): RBC / HPF: NONE SEEN RBC/hpf (ref 0–5)

## 2020-03-03 LAB — COMPREHENSIVE METABOLIC PANEL
ALT: 12 U/L (ref 0–44)
AST: 18 U/L (ref 15–41)
Albumin: 4 g/dL (ref 3.5–5.0)
Alkaline Phosphatase: 68 U/L (ref 38–126)
Anion gap: 8 (ref 5–15)
BUN: 12 mg/dL (ref 6–20)
CO2: 25 mmol/L (ref 22–32)
Calcium: 8.3 mg/dL — ABNORMAL LOW (ref 8.9–10.3)
Chloride: 105 mmol/L (ref 98–111)
Creatinine, Ser: 0.94 mg/dL (ref 0.44–1.00)
GFR, Estimated: >60 mL/min (ref >60)
Glucose, Bld: 89 mg/dL (ref 70–99)
Potassium: 3.4 mmol/L — ABNORMAL LOW (ref 3.5–5.1)
Sodium: 138 mmol/L (ref 135–145)
Total Bilirubin: 0.4 mg/dL (ref 0.3–1.2)
Total Protein: 7.2 g/dL (ref 6.5–8.1)

## 2020-03-03 LAB — PREGNANCY, URINE: Preg Test, Ur: NEGATIVE

## 2020-03-03 LAB — LIPASE, BLOOD: Lipase: 20 U/L (ref 11–51)

## 2020-03-03 MED ORDER — TAMSULOSIN HCL 0.4 MG PO CAPS: 0.4000 mg | ORAL_CAPSULE | Freq: Every day | ORAL | 0 refills | Status: DC

## 2020-03-03 MED ORDER — KETOROLAC TROMETHAMINE 15 MG/ML IJ SOLN
15.0000 mg | Freq: Once | INTRAMUSCULAR | Status: AC
  Administered 2020-03-03: 15 mg via INTRAVENOUS
  Filled 2020-03-03: qty 1

## 2020-03-03 MED ORDER — MORPHINE SULFATE 15 MG PO TABS: 7.5000 mg | ORAL_TABLET | ORAL | 0 refills | Status: DC | PRN

## 2020-03-03 MED ORDER — MORPHINE SULFATE (PF) 4 MG/ML IV SOLN
4.0000 mg | Freq: Once | INTRAVENOUS | Status: AC
  Administered 2020-03-03: 4 mg via INTRAVENOUS
  Filled 2020-03-03: qty 1

## 2020-03-03 MED ORDER — SODIUM CHLORIDE 0.9 % IV BOLUS
1000.0000 mL | Freq: Once | INTRAVENOUS | Status: AC
  Administered 2020-03-03: 1000 mL via INTRAVENOUS

## 2020-03-03 MED ORDER — ONDANSETRON HCL 4 MG/2ML IJ SOLN
4.0000 mg | Freq: Once | INTRAMUSCULAR | Status: AC
  Administered 2020-03-03: 4 mg via INTRAVENOUS
  Filled 2020-03-03: qty 2

## 2020-03-03 MED ORDER — IOHEXOL 300 MG/ML  SOLN
100.0000 mL | Freq: Once | INTRAMUSCULAR | Status: AC | PRN
  Administered 2020-03-03: 100 mL via INTRAVENOUS

## 2020-03-03 MED ORDER — ONDANSETRON 4 MG PO TBDP: ORAL_TABLET | ORAL | 0 refills | Status: DC

## 2020-03-03 NOTE — Discharge Instructions (Addendum)

## 2020-03-03 NOTE — ED Triage Notes (Signed)
Abdominal pain LLQ this am. Pain is sharp in nature. She is having nausea and diarrhea. She is pale.

## 2020-03-03 NOTE — ED Provider Notes (Signed)
[facility] EMERGENCY DEPARTMENT Provider Note   CSN: [id number] Arrival date & time: 03/03/20  1657     History Chief Complaint  Patient presents with  . Abdominal Pain    Beverly Ford is a 44 y.o. female.  44 yo F with a chief complaint of left lower quadrant abdominal pain.  Going on for couple days.  Started with nausea now is feeling painful.  There all the time but has some sharp pain that radiates down to her groin.  No trauma nausea without vomiting.  She has a history of diverticulitis thinks this feels similar but slightly different.  Also has a history of an obstructing renal stone that required intervention.  Denies urinary symptoms denies vaginal bleeding or discharge.  The history is provided by the patient.  Abdominal Pain Pain location:  LLQ Pain radiates to:  Does not radiate Pain severity:  Moderate Onset quality:  Gradual Duration:  2 days Timing:  Constant Progression:  Worsening Chronicity:  New Relieved by:  Nothing Worsened by:  Nothing Ineffective treatments:  None tried Associated symptoms: nausea   Associated symptoms: no chest pain, no chills, no dysuria, no fever, no shortness of breath and no vomiting        Past Medical History:  Diagnosis Date  . Depression   . DVT (deep venous thrombosis) (HCC)    after surgery  . Renal disorder    hx kidney stones    There are no problems to display for this patient.   Past Surgical History:  Procedure Laterality Date  . BACK SURGERY    . BUNIONECTOMY    . CHOLECYSTECTOMY    . SPINAL FUSION    . TUBAL LIGATION       OB History   No obstetric history on file.     No family history on file.  Social History   Tobacco Use  . Smoking status: Never Smoker  . Smokeless tobacco: Never Used  Substance Use Topics  . Alcohol use: Yes    Comment: 1/week  . Drug use: Not Currently    Home Medications Prior to Admission medications   Medication Sig Start Date End Date Taking?  Authorizing Provider  albuterol (VENTOLIN HFA) 108 (90 Base) MCG/ACT inhaler Inhale 1-2 puffs into the lungs every 6 (six) hours as needed for wheezing or shortness of breath. 04/27/19   Bing Neighbors, FNP  benzonatate (TESSALON) 100 MG capsule Take 1-2 capsules (100-200 mg total) by mouth 3 (three) times daily as needed for cough. 04/27/19   Bing Neighbors, FNP  cephALEXin (KEFLEX) 500 MG capsule Take 1 capsule (500 mg total) by mouth 2 (two) times daily. 04/16/19   Lattie Haw, MD  gabapentin (NEURONTIN) 300 MG capsule TAKE ONE CAPSULE BY MOUTH TWICE A DAY 10/07/18   [provider]  hydrochlorothiazide (HYDRODIURIL) 12.5 MG tablet Take by mouth. 05/21/18 05/21/19  [provider]  ipratropium (ATROVENT) 0.06 % nasal spray Place 2 sprays into both nostrils 3 (three) times daily. 04/27/19   Bing Neighbors, FNP  levocetirizine (XYZAL) 5 MG tablet Take 1 tablet (5 mg total) by mouth every evening. 04/27/19   Bing Neighbors, FNP  morphine (MSIR) 15 MG tablet Take 0.5 tablets (7.5 mg total) by mouth every 4 (four) hours as needed for severe pain. 03/03/20   Melene Plan, DO  ondansetron (ZOFRAN ODT) 4 MG disintegrating tablet 4mg  ODT q4 hours prn nausea/vomit 03/03/20   13/11/21, DO  promethazine-dextromethorphan (PROMETHAZINE-DM)  6.25-15 MG/5ML syrup Take 5 mLs by mouth 4 (four) times daily as needed for cough. 03/24/19   McVey, Madelaine BhatElizabeth Whitney, PA-C  sertraline (ZOLOFT) 50 MG tablet Take 100 mg by mouth daily.     [provider]  tamsulosin (FLOMAX) 0.4 MG CAPS capsule Take 1 capsule (0.4 mg total) by mouth daily after supper. 03/03/20   Melene PlanFloyd, Everlene Cunning, DO  traZODone (DESYREL) 50 MG tablet Take by mouth. 04/09/19 05/09/19  [provider]    Allergies    Iodine and Shellfish allergy  Review of Systems   Review of Systems  Constitutional: Negative for chills and fever.  HENT: Negative for congestion and rhinorrhea.   Eyes: Negative for redness and  visual disturbance.  Respiratory: Negative for shortness of breath and wheezing.   Cardiovascular: Negative for chest pain and palpitations.  Gastrointestinal: Positive for abdominal pain and nausea. Negative for vomiting.  Genitourinary: Negative for dysuria and urgency.  Musculoskeletal: Negative for arthralgias and myalgias.  Skin: Negative for pallor and wound.  Neurological: Negative for dizziness and headaches.    Physical Exam Updated Vital Signs BP 112/68 (BP Location: Right Arm)   Pulse (!) 56   Temp 98 F (36.7 C) (Oral)   Resp 15   Ht 5\' 3"  (1.6 m)   Wt 72.6 kg   LMP 02/15/2020   SpO2 99%   BMI 28.34 kg/m   Physical Exam Vitals and nursing note reviewed.  Constitutional:      General: She is not in acute distress.    Appearance: She is well-developed. She is not diaphoretic.  HENT:     Head: Normocephalic and atraumatic.  Eyes:     Pupils: Pupils are equal, round, and reactive to light.  Cardiovascular:     Rate and Rhythm: Normal rate and regular rhythm.     Heart sounds: No murmur heard.  No friction rub. No gallop.   Pulmonary:     Effort: Pulmonary effort is normal.     Breath sounds: No wheezing or rales.  Abdominal:     General: There is no distension.     Palpations: Abdomen is soft.     Tenderness: There is abdominal tenderness (worst to the LLQ).  Musculoskeletal:        General: No tenderness.     Cervical back: Normal range of motion and neck supple.  Skin:    General: Skin is warm and dry.  Neurological:     Mental Status: She is alert and oriented to person, place, and time.  Psychiatric:        Behavior: Behavior normal.     ED Results / Procedures / Treatments   Labs (all labs ordered are listed, but only abnormal results are displayed) Labs Reviewed  COMPREHENSIVE METABOLIC PANEL - Abnormal; Notable for the following components:      Result Value   Potassium 3.4 (*)    Calcium 8.3 (*)    All other components within normal  limits  URINALYSIS, ROUTINE W REFLEX MICROSCOPIC - Abnormal; Notable for the following components:   APPearance HAZY (*)    Leukocytes,Ua TRACE (*)    All other components within normal limits  URINALYSIS, MICROSCOPIC (REFLEX) - Abnormal; Notable for the following components:   Bacteria, UA RARE (*)    All other components within normal limits  LIPASE, BLOOD  CBC  PREGNANCY, URINE    EKG None  Radiology CT ABDOMEN PELVIS W CONTRAST  Result Date: 03/03/2020 CLINICAL DATA:  Acute onset of  LEFT LOWER QUADRANT abdominal pain that began this morning, associated with nausea and diarrhea. EXAM: CT ABDOMEN AND PELVIS WITH CONTRAST TECHNIQUE: Multidetector CT imaging of the abdomen and pelvis was performed using the standard protocol following bolus administration of intravenous contrast. CONTRAST:  OMNIPAQUE IOHEXOL 300 MG/ML IV. COMPARISON:  None. FINDINGS: Lower chest: Visualized lung bases clear apart from the expected mild dependent atelectasis posteriorly. Heart size normal. Hepatobiliary: 6 mm benign cyst in the posterior segment RIGHT lobe of liver. No significant focal hepatic parenchymal abnormalities. Surgically absent gallbladder. No unexpected biliary ductal dilation. Pancreas: Normal in appearance without evidence of mass, ductal dilation, or inflammation. Spleen: Normal in size and appearance. Heterogeneous enhancement on the initial series is due to the early phase of enhancement, as the spleen is homogeneous on the delayed images. Adrenals/Urinary Tract: Normal appearing adrenal glands. Mildly obstructing 4-5 mm calculus at the LEFT ureterovesical junction causing mild LEFT hydronephrosis but no significant delay in contrast excretion. Non-obstructing very small (2-3 mm) calculi in mid and lower pole calices of both kidneys. No focal parenchymal abnormality involving either kidney. Normal appearing urinary bladder. Stomach/Bowel: Stomach normal in appearance for the degree of  distention. Normal-appearing small bowel. Entire colon relatively decompressed. Distal descending and sigmoid colon diverticulosis without evidence of acute diverticulitis. Mild wall thickening with edema involving the transverse colon. Remainder of the colon normal in appearance. Normal appendix in the RIGHT upper pelvis. Vascular/Lymphatic: No visible aortoiliofemoral atherosclerosis. Widely patent visceral arteries. Normal-appearing portal venous and systemic venous systems. No pathologic lymphadenopathy. Reproductive: Normal-appearing uterus and ovaries without evidence of a pathologic adnexal mass. Other: Small amount of free fluid in the dependent portion of the pelvis (in the cul-de-sac). Musculoskeletal: Prior L5-S1 fusion without complicating features. No acute findings. IMPRESSION: 1. Mildly obstructing 4-5 mm calculus at the LEFT ureterovesical junction. 2. Non-obstructing very small (2-3 mm) calculi in mid and lower pole calices of both kidneys. 3. Distal descending and sigmoid colon diverticulosis without evidence of acute diverticulitis. 4. Mild wall thickening with edema involving the transverse colon, possibly indicating mild colitis in this patient with diarrhea. 5. Small amount of free fluid in the dependent portion of the pelvis (in the cul-de-sac), likely physiologic. Electronically Signed   By: Hulan Saas M.D.   On: 03/03/2020 19:43    Procedures Procedures (including critical care time)  Medications Ordered in ED Medications  ketorolac (TORADOL) 15 MG/ML injection 15 mg (has no administration in time range)  morphine 4 MG/ML injection 4 mg (4 mg Intravenous Given 03/03/20 1851)  ondansetron (ZOFRAN) injection 4 mg (4 mg Intravenous Given 03/03/20 1850)  sodium chloride 0.9 % bolus 1,000 mL (1,000 mLs Intravenous New Bag/Given 03/03/20 1850)  iohexol (OMNIPAQUE) 300 MG/ML solution 100 mL (100 mLs Intravenous Contrast Given 03/03/20 1909)    ED Course  I have reviewed the  triage vital signs and the nursing notes.  Pertinent labs & imaging results that were available during my care of the patient were reviewed by me and considered in my medical decision making (see chart for details).    MDM Rules/Calculators/A&P                          44 yo F with a chief complaint of left lower quadrant abdominal pain.  Going on for about 3 days.  Leukocytosis ear pain on exam.  Will CT.  CT scan with left-sided kidney stone.  No signs of diverticulitis.  No other intra-abdominal pathology.  Patient feeling better on reassessment.  No UTI on UA.  Will discharge patient home.  PCP follow-up.  Urology follow-up.  8:01 PM:  I have discussed the diagnosis/risks/treatment options with the patient and believe the pt to be eligible for discharge home to follow-up with Urology. We also discussed returning to the ED immediately if new or worsening sx occur. We discussed the sx which are most concerning (e.g., sudden worsening pain, fever, inability to tolerate by mouth) that necessitate immediate return. Medications administered to the patient during their visit and any new prescriptions provided to the patient are listed below.  Medications given during this visit Medications  ketorolac (TORADOL) 15 MG/ML injection 15 mg (has no administration in time range)  morphine 4 MG/ML injection 4 mg (4 mg Intravenous Given 03/03/20 1851)  ondansetron (ZOFRAN) injection 4 mg (4 mg Intravenous Given 03/03/20 1850)  sodium chloride 0.9 % bolus 1,000 mL (1,000 mLs Intravenous New Bag/Given 03/03/20 1850)  iohexol (OMNIPAQUE) 300 MG/ML solution 100 mL (100 mLs Intravenous Contrast Given 03/03/20 1909)     The patient appears reasonably screen and/or stabilized for discharge and I doubt any other medical condition or other [facility] requiring further screening, evaluation, or treatment in the ED at this time prior to discharge.   Final Clinical Impression(s) / ED Diagnoses Final diagnoses:    Nephrolithiasis    Rx / DC Orders ED Discharge Orders         Ordered    morphine (MSIR) 15 MG tablet  Every 4 hours PRN        03/03/20 1959    ondansetron (ZOFRAN ODT) 4 MG disintegrating tablet        03/03/20 1959    tamsulosin (FLOMAX) 0.4 MG CAPS capsule  Daily after supper        03/03/20 1959           Melene Plan, DO 03/03/20 2001

## 2020-03-09 ENCOUNTER — Ambulatory Visit: Payer: Managed Care, Other (non HMO) | Admitting: Podiatry

## 2020-03-23 ENCOUNTER — Other Ambulatory Visit: Payer: Self-pay | Admitting: Urology

## 2020-03-24 NOTE — Addendum Note (Signed)
Addended by: Crist Fat on: 03/24/2020 06:52 PM   Modules accepted: Orders

## 2020-04-04 ENCOUNTER — Other Ambulatory Visit: Payer: Self-pay

## 2020-04-04 ENCOUNTER — Other Ambulatory Visit (HOSPITAL_COMMUNITY)
Admission: RE | Admit: 2020-04-04 | Discharge: 2020-04-04 | Disposition: A | Discharge status: Home / Self Care | Payer: Managed Care, Other (non HMO) | Source: Ambulatory Visit | Attending: Urology | Admitting: Urology

## 2020-04-04 ENCOUNTER — Encounter (HOSPITAL_BASED_OUTPATIENT_CLINIC_OR_DEPARTMENT_OTHER): Payer: Self-pay | Admitting: Urology

## 2020-04-04 DIAGNOSIS — Z20822 Contact with and (suspected) exposure to covid-19: Secondary | ICD-10-CM | POA: Insufficient documentation

## 2020-04-04 DIAGNOSIS — Z01812 Encounter for preprocedural laboratory examination: Secondary | ICD-10-CM | POA: Insufficient documentation

## 2020-04-04 NOTE — Progress Notes (Signed)
Spoke w/ via phone for pre-op interview--- PT Lab needs dos----  Istat and Urine preg              Lab results------ no COVID test ------ done today 04-04-2020, results in epic Arrive at ------- 0730 NPO after MN NO Solid Food.  Clear liquids from MN until--- 0630 Medications to take morning of surgery ----- NONE Diabetic medication ----- n/a Patient Special Instructions ----- n/a Pre-Op special Istructions ----- n/a Patient verbalized understanding of instructions that were given at this phone interview. Patient denies shortness of breath, chest pain, fever, cough at this phone interview.

## 2020-04-05 LAB — SARS CORONAVIRUS 2 (TAT 6-24 HRS): SARS Coronavirus 2: NEGATIVE

## 2020-04-07 ENCOUNTER — Encounter (HOSPITAL_BASED_OUTPATIENT_CLINIC_OR_DEPARTMENT_OTHER)
Admission: RE | Disposition: A | Discharge status: Home / Self Care | Payer: Self-pay | Source: Home / Self Care | Attending: Urology

## 2020-04-07 ENCOUNTER — Encounter (HOSPITAL_BASED_OUTPATIENT_CLINIC_OR_DEPARTMENT_OTHER): Payer: Self-pay | Admitting: Urology

## 2020-04-07 ENCOUNTER — Ambulatory Visit (HOSPITAL_BASED_OUTPATIENT_CLINIC_OR_DEPARTMENT_OTHER): Payer: Managed Care, Other (non HMO) | Admitting: Anesthesiology

## 2020-04-07 ENCOUNTER — Ambulatory Visit (HOSPITAL_BASED_OUTPATIENT_CLINIC_OR_DEPARTMENT_OTHER)
Admission: RE | Admit: 2020-04-07 | Discharge: 2020-04-07 | Disposition: A | Discharge status: Home / Self Care | Payer: Managed Care, Other (non HMO) | Attending: Urology | Admitting: Urology

## 2020-04-07 ENCOUNTER — Other Ambulatory Visit: Payer: Self-pay

## 2020-04-07 DIAGNOSIS — Z87442 Personal history of urinary calculi: Secondary | ICD-10-CM | POA: Insufficient documentation

## 2020-04-07 DIAGNOSIS — N200 Calculus of kidney: Secondary | ICD-10-CM

## 2020-04-07 DIAGNOSIS — Z888 Allergy status to other drugs, medicaments and biological substances status: Secondary | ICD-10-CM | POA: Diagnosis not present

## 2020-04-07 DIAGNOSIS — N202 Calculus of kidney with calculus of ureter: Secondary | ICD-10-CM | POA: Insufficient documentation

## 2020-04-07 DIAGNOSIS — N201 Calculus of ureter: Secondary | ICD-10-CM | POA: Diagnosis present

## 2020-04-07 HISTORY — DX: Personal history of other diseases of the digestive system: Z87.19

## 2020-04-07 HISTORY — DX: Personal history of urinary calculi: Z87.442

## 2020-04-07 HISTORY — DX: Brachial plexus disorders: G54.0

## 2020-04-07 HISTORY — DX: Major depressive disorder, single episode, unspecified: F32.9

## 2020-04-07 HISTORY — DX: Calculus of ureter: N20.1

## 2020-04-07 HISTORY — DX: Obstructive sleep apnea (adult) (pediatric): G47.33

## 2020-04-07 HISTORY — PX: CYSTOSCOPY/URETEROSCOPY/HOLMIUM LASER/STENT PLACEMENT: SHX6546

## 2020-04-07 HISTORY — DX: Calculus of kidney: N20.0

## 2020-04-07 HISTORY — DX: Generalized anxiety disorder: F41.1

## 2020-04-07 LAB — POCT I-STAT, CHEM 8
BUN: 8 mg/dL (ref 6–20)
Calcium, Ion: 1.23 mmol/L (ref 1.15–1.40)
Chloride: 108 mmol/L (ref 98–111)
Creatinine, Ser: 0.7 mg/dL (ref 0.44–1.00)
Glucose, Bld: 102 mg/dL — ABNORMAL HIGH (ref 70–99)
HCT: 34 % — ABNORMAL LOW (ref 36.0–46.0)
Hemoglobin: 11.6 g/dL — ABNORMAL LOW (ref 12.0–15.0)
Potassium: 3.7 mmol/L (ref 3.5–5.1)
Sodium: 141 mmol/L (ref 135–145)
TCO2: 20 mmol/L — ABNORMAL LOW (ref 22–32)

## 2020-04-07 SURGERY — CYSTOSCOPY/URETEROSCOPY/HOLMIUM LASER/STENT PLACEMENT
Anesthesia: General | Site: Ureter | Laterality: Left

## 2020-04-07 MED ORDER — FENTANYL CITRATE (PF) 100 MCG/2ML IJ SOLN
INTRAMUSCULAR | Status: DC | PRN
  Administered 2020-04-07: 25 ug via INTRAVENOUS
  Administered 2020-04-07: 50 ug via INTRAVENOUS
  Administered 2020-04-07 (×5): 25 ug via INTRAVENOUS

## 2020-04-07 MED ORDER — PROPOFOL 10 MG/ML IV BOLUS
INTRAVENOUS | Status: AC
  Filled 2020-04-07: qty 20

## 2020-04-07 MED ORDER — CIPROFLOXACIN HCL 500 MG PO TABS: 500.0000 mg | ORAL_TABLET | Freq: Once | ORAL | 0 refills | Status: AC

## 2020-04-07 MED ORDER — PHENYLEPHRINE HCL (PRESSORS) 10 MG/ML IV SOLN
INTRAVENOUS | Status: DC | PRN
  Administered 2020-04-07 (×3): 80 ug via INTRAVENOUS

## 2020-04-07 MED ORDER — FENTANYL CITRATE (PF) 100 MCG/2ML IJ SOLN
INTRAMUSCULAR | Status: AC
  Filled 2020-04-07: qty 2

## 2020-04-07 MED ORDER — IOHEXOL 300 MG/ML  SOLN
INTRAMUSCULAR | Status: DC | PRN
  Administered 2020-04-07: 14 mL via URETHRAL

## 2020-04-07 MED ORDER — TRAMADOL HCL 50 MG PO TABS: 50.0000 mg | ORAL_TABLET | Freq: Four times a day (QID) | ORAL | 0 refills | Status: DC | PRN

## 2020-04-07 MED ORDER — PROMETHAZINE HCL 25 MG/ML IJ SOLN: 6.2500 mg | INTRAMUSCULAR | Status: DC | PRN

## 2020-04-07 MED ORDER — TAMSULOSIN HCL 0.4 MG PO CAPS: 0.4000 mg | ORAL_CAPSULE | Freq: Every day | ORAL | Status: DC

## 2020-04-07 MED ORDER — HYDROMORPHONE HCL 1 MG/ML IJ SOLN: 0.2500 mg | INTRAMUSCULAR | Status: DC | PRN

## 2020-04-07 MED ORDER — PHENYLEPHRINE 40 MCG/ML (10ML) SYRINGE FOR IV PUSH (FOR BLOOD PRESSURE SUPPORT)
PREFILLED_SYRINGE | INTRAVENOUS | Status: AC
  Filled 2020-04-07: qty 10

## 2020-04-07 MED ORDER — PHENAZOPYRIDINE HCL 200 MG PO TABS: 200.0000 mg | ORAL_TABLET | Freq: Three times a day (TID) | ORAL | 0 refills | Status: DC | PRN

## 2020-04-07 MED ORDER — DEXAMETHASONE SODIUM PHOSPHATE 10 MG/ML IJ SOLN
INTRAMUSCULAR | Status: AC
  Filled 2020-04-07: qty 1

## 2020-04-07 MED ORDER — MIDAZOLAM HCL 2 MG/2ML IJ SOLN
INTRAMUSCULAR | Status: AC
  Filled 2020-04-07: qty 2

## 2020-04-07 MED ORDER — OXYCODONE HCL 5 MG PO TABS: 5.0000 mg | ORAL_TABLET | Freq: Once | ORAL | Status: DC | PRN

## 2020-04-07 MED ORDER — DEXAMETHASONE SODIUM PHOSPHATE 4 MG/ML IJ SOLN
INTRAMUSCULAR | Status: DC | PRN
  Administered 2020-04-07: 10 mg via INTRAVENOUS

## 2020-04-07 MED ORDER — PROPOFOL 10 MG/ML IV BOLUS
INTRAVENOUS | Status: DC | PRN
  Administered 2020-04-07: 200 mg via INTRAVENOUS
  Administered 2020-04-07: 100 mg via INTRAVENOUS

## 2020-04-07 MED ORDER — CEFAZOLIN SODIUM-DEXTROSE 2-4 GM/100ML-% IV SOLN
2.0000 g | INTRAVENOUS | Status: AC
  Administered 2020-04-07: 2 g via INTRAVENOUS

## 2020-04-07 MED ORDER — LIDOCAINE HCL (CARDIAC) PF 100 MG/5ML IV SOSY
PREFILLED_SYRINGE | INTRAVENOUS | Status: DC | PRN
  Administered 2020-04-07: 40 mg via INTRAVENOUS

## 2020-04-07 MED ORDER — MIDAZOLAM HCL 5 MG/5ML IJ SOLN
INTRAMUSCULAR | Status: DC | PRN
  Administered 2020-04-07: 2 mg via INTRAVENOUS

## 2020-04-07 MED ORDER — ONDANSETRON HCL 4 MG/2ML IJ SOLN
INTRAMUSCULAR | Status: DC | PRN
  Administered 2020-04-07: 4 mg via INTRAVENOUS

## 2020-04-07 MED ORDER — KETOROLAC TROMETHAMINE 30 MG/ML IJ SOLN: 30.0000 mg | Freq: Once | INTRAMUSCULAR | Status: DC | PRN

## 2020-04-07 MED ORDER — MEPERIDINE HCL 25 MG/ML IJ SOLN: 6.2500 mg | INTRAMUSCULAR | Status: DC | PRN

## 2020-04-07 MED ORDER — SCOPOLAMINE 1 MG/3DAYS TD PT72: 1.0000 | MEDICATED_PATCH | TRANSDERMAL | Status: DC

## 2020-04-07 MED ORDER — OXYCODONE HCL 5 MG/5ML PO SOLN: 5.0000 mg | Freq: Once | ORAL | Status: DC | PRN

## 2020-04-07 MED ORDER — CEFAZOLIN SODIUM-DEXTROSE 2-4 GM/100ML-% IV SOLN
INTRAVENOUS | Status: AC
  Filled 2020-04-07: qty 100

## 2020-04-07 MED ORDER — LIDOCAINE HCL (PF) 2 % IJ SOLN
INTRAMUSCULAR | Status: AC
  Filled 2020-04-07: qty 5

## 2020-04-07 MED ORDER — ONDANSETRON HCL 4 MG/2ML IJ SOLN
INTRAMUSCULAR | Status: AC
  Filled 2020-04-07: qty 2

## 2020-04-07 MED ORDER — LACTATED RINGERS IV SOLN: INTRAVENOUS | Status: DC

## 2020-04-07 MED ORDER — ACETAMINOPHEN 500 MG PO TABS: 1000.0000 mg | ORAL_TABLET | Freq: Once | ORAL | Status: DC

## 2020-04-07 MED ORDER — KETOROLAC TROMETHAMINE 30 MG/ML IJ SOLN
INTRAMUSCULAR | Status: DC | PRN
  Administered 2020-04-07: 30 mg via INTRAVENOUS

## 2020-04-07 SURGICAL SUPPLY — 22 items
BAG DRAIN URO-CYSTO SKYTR STRL (DRAIN) ×2 IMPLANT
BAG DRN UROCATH (DRAIN) ×1
BASKET LASER NITINOL 1.9FR (BASKET) IMPLANT
BSKT STON RTRVL 120 1.9FR (BASKET)
CATH URET 5FR 28IN OPEN ENDED (CATHETERS) ×2 IMPLANT
CATH URET DUAL LUMEN 6-10FR 50 (CATHETERS) IMPLANT
CLOTH BEACON ORANGE TIMEOUT ST (SAFETY) ×2 IMPLANT
EXTRACTOR STONE 1.7FRX115CM (UROLOGICAL SUPPLIES) IMPLANT
EXTRACTOR STONE NITINOL NGAGE (UROLOGICAL SUPPLIES) ×2 IMPLANT
GLOVE BIO SURGEON STRL SZ7.5 (GLOVE) ×2 IMPLANT
GOWN STRL REUS W/TWL XL LVL3 (GOWN DISPOSABLE) ×2 IMPLANT
GUIDEWIRE STR DUAL SENSOR (WIRE) ×2 IMPLANT
IV NS IRRIG 3000ML ARTHROMATIC (IV SOLUTION) ×4 IMPLANT
KIT TURNOVER CYSTO (KITS) ×2 IMPLANT
MANIFOLD NEPTUNE II (INSTRUMENTS) ×2 IMPLANT
NS IRRIG 500ML POUR BTL (IV SOLUTION) ×2 IMPLANT
PACK CYSTO (CUSTOM PROCEDURE TRAY) ×2 IMPLANT
STENT URET 6FRX24 CONTOUR (STENTS) ×2 IMPLANT
TRACTIP FLEXIVA PULS ID 200XHI (Laser) IMPLANT
TRACTIP FLEXIVA PULSE ID 200 (Laser)
TUBE CONNECTING 12X1/4 (SUCTIONS) IMPLANT
TUBING UROLOGY SET (TUBING) ×2 IMPLANT

## 2020-04-07 NOTE — Discharge Instructions (Signed)
DISCHARGE INSTRUCTIONS FOR KIDNEY STONE/URETERAL STENT   MEDICATIONS:  1.  Resume all your other meds from home - except do not take any extra narcotic pain meds that you may have at home.  2. Pyridium is to help with the burning/stinging when you urinate. 3. Tramadol is for moderate/severe pain, otherwise taking upto 1000 mg every 6 hours of plainTylenol will help treat your pain.   4. Take Cipro one hour prior to removal of your stent.   ACTIVITY:  1. No strenuous activity x 1week  2. No driving while on narcotic pain medications  3. Drink plenty of water  4. Continue to walk at home - you can still get blood clots when you are at home, so keep active, but don't over do it.  5. May return to work/school tomorrow or when you feel ready   BATHING:  1. You can shower and we recommend daily showers  2. You have a string coming from your urethra: The stent string is attached to your ureteral stent. Do not pull on this.   SIGNS/SYMPTOMS TO CALL:  Please call us if you have a fever greater than 101.5, uncontrolled nausea/vomiting, uncontrolled pain, dizziness, unable to urinate, bloody urine, chest pain, shortness of breath, leg swelling, leg pain, redness around wound, drainage from wound, or any other concerns or questions.   You can reach [place] at [phone].   FOLLOW-UP:  1. You have an appointment in 6 weeks with a ultrasound of your kidneys prior.  2. You have a string attached to your stent, you may remove it on December 20th. To do this, pull the strings until the stents are completely removed. You may feel an odd sensation in your back.   Post Anesthesia Home Care Instructions  Activity: Get plenty of rest for the remainder of the day. A responsible individual must stay with you for 24 hours following the procedure.  For the next 24 hours, DO NOT: -Drive a car -Advertising copywriter -Drink alcoholic beverages -Take any medication unless instructed by your physician -Make any  legal decisions or sign important papers.  Meals: Start with liquid foods such as gelatin or soup. Progress to regular foods as tolerated. Avoid greasy, spicy, heavy foods. If nausea and/or vomiting occur, drink only clear liquids until the nausea and/or vomiting subsides. Call your physician if vomiting continues.  Special Instructions/Symptoms: Your throat may feel dry or sore from the anesthesia or the breathing tube placed in your throat during surgery. If this causes discomfort, gargle with warm salt water. The discomfort should disappear within 24 hours.

## 2020-04-07 NOTE — Anesthesia Procedure Notes (Signed)
Procedure Name: LMA Insertion Date/Time: 04/07/2020 9:51 AM Performed by: Jessica Priest, CRNA Pre-anesthesia Checklist: Patient identified, Emergency Drugs available, Suction available, Patient being monitored and Timeout performed Patient Re-evaluated:Patient Re-evaluated prior to induction Oxygen Delivery Method: Circle system utilized Preoxygenation: Pre-oxygenation with 100% oxygen Induction Type: IV induction Ventilation: Mask ventilation without difficulty LMA: LMA inserted LMA Size: 4.0 Number of attempts: 1 Airway Equipment and Method: Bite block Placement Confirmation: positive ETCO2,  CO2 detector and breath sounds checked- equal and bilateral Tube secured with: Tape Dental Injury: Teeth and Oropharynx as per pre-operative assessment

## 2020-04-07 NOTE — Interval H&P Note (Signed)
History and Physical Interval Note:  04/07/2020 9:15 AM  Beverly Ford  has presented today for surgery, with the diagnosis of LEFT DISTAL STONE.  The various methods of treatment have been discussed with the patient and family. After consideration of risks, benefits and other options for treatment, the patient has consented to  Procedure(s): CYSTOSCOPY LEFT RETROGRADE PYELOGRAM URETEROSCOPY/HOLMIUM LASER/STENT PLACEMENT (Left) as a surgical intervention.  The patient's history has been reviewed, patient examined, no change in status, stable for surgery.  I have reviewed the patient's chart and labs.  Questions were answered to the patient's satisfaction.     Crist Fat

## 2020-04-07 NOTE — Anesthesia Postprocedure Evaluation (Signed)
Anesthesia Post Note  Patient: Beverly Ford  Procedure(s) Performed: CYSTOSCOPY LEFT RETROGRADE PYELOGRAM URETEROSCOPY/HOLMIUM LASER/STENT PLACEMENT (Left Ureter)     Patient location during evaluation: PACU Anesthesia Type: General Level of consciousness: awake and alert, oriented and patient cooperative Pain management: pain level controlled Vital Signs Assessment: post-procedure vital signs reviewed and stable Respiratory status: spontaneous breathing, nonlabored ventilation and respiratory function stable Cardiovascular status: blood pressure returned to baseline and stable Postop Assessment: no apparent nausea or vomiting Anesthetic complications: no   No complications documented.  Last Vitals:  Vitals:   04/07/20 1115 04/07/20 1130  BP: 112/70 (!) 112/55  Pulse: 72 (!) 58  Resp: 12 17  Temp:    SpO2: 96% 97%    Last Pain:  Vitals:   04/07/20 1130  TempSrc:   PainSc: 0-No pain                 Lannie Fields

## 2020-04-07 NOTE — Transfer of Care (Signed)
Immediate Anesthesia Transfer of Care Note  Patient: Beverly Ford  Procedure(s) Performed: Procedure(s) (LRB): CYSTOSCOPY LEFT RETROGRADE PYELOGRAM URETEROSCOPY/HOLMIUM LASER/STENT PLACEMENT (Left)  Patient Location: PACU  Anesthesia Type: General  Level of Consciousness: awake, sedated, patient cooperative and responds to stimulation  Airway & Oxygen Therapy: Patient Spontanous Breathing and Patient connected to Steward 02 and soft FM   Post-op Assessment: Report given to PACU RN, Post -op Vital signs reviewed and stable and Patient moving all extremities  Post vital signs: Reviewed and stable  Complications: No apparent anesthesia complications

## 2020-04-07 NOTE — Progress Notes (Signed)
Dr. Marlou Porch aware pt has finished Flomax prescription. No refills needed at this time. She can call for refill if needed.

## 2020-04-07 NOTE — Op Note (Signed)
Preoperative diagnosis: left ureteral calculus  Postoperative diagnosis: passed left ureteral calculus, non-obstructive left renal stones  Procedure:  1. Cystoscopy 2. left ureteroscopy and stone removal 3. left 12F x 24cm ureteral stent placement  4. left retrograde pyelography with interpretation  Surgeon: Crist Fat, MD  Anesthesia: General  Complications: None  Intraoperative findings: #1:  The retrograde pyelogram of the patient's left ureter demonstrated no obstructing stones within the ureter or hydroureteronephrosis. #2: Ureteroscopy confirmed the retrograde, there were no stones within the ureter. 3: I did take the flexible scope into the patient's left kidney and removed it 3 papillary tip calcifications. #4: At the end of the case I placed a 24 cm time 6 French double-J ureteral stent with a stent tether pulled through the urethra and tucked into the patient's vagina.  EBL: Minimal  Specimens: 1. Left renal calculus  Disposition of specimens: [facility] for stone analysis  Indication: Beverly Ford is a 44 y.o.   patient with a left ureteral stone and associated left symptoms.  She followed up with [place] a one-point after we had thought that she had passed a stone with persistent symptoms.  A repeat CT scan did show a distal ureteral stone.  In the preop holding area today the patient did indicate that she had not passed her stone.  After reviewing the management options for treatment, the patient elected to proceed with the above surgical procedure(s). We have discussed the potential benefits and risks of the procedure, side effects of the proposed treatment, the likelihood of the patient achieving the goals of the procedure, and any potential problems that might occur during the procedure or recuperation. Informed consent has been obtained.   Description of procedure:  The patient was taken to the operating room and general anesthesia was  induced.  The patient was placed in the dorsal lithotomy position, prepped and draped in the usual sterile fashion, and preoperative antibiotics were administered. A preoperative time-out was performed.   Cystourethroscopy was performed.  The patient's urethra was examined and was normal.  The bladder was then systematically examined in its entirety. There was no evidence for any bladder tumors, stones, or other mucosal pathology.    Attention then turned to the left ureteral orifice and a ureteral catheter was used to intubate the ureteral orifice.  Omnipaque contrast was injected through the ureteral catheter and a retrograde pyelogram was performed with findings as dictated above.  A 0.38 sensor guidewire was then advanced up the left ureter into the renal pelvis under fluoroscopic guidance. The 6 Fr semirigid ureteroscope was then advanced into the ureter next to the guidewire up to the UPJ.  No stone was identified.  I then removed the rigid ureteroscope and exchanged for the single lumen flexible ureteroscope.  Pyeloscopy was performed and there were 3 papillary tic calcifications that I removed using the engage basket.  These were all brought into the ureter and removed simultaneously with 1 basket.  I then exchanged the ureteroscope for cystoscopy and repeated the retrograde pyelogram.  There was some swelling of the distal ureter but no appreciable filling defects within the ureter or within the collecting system.  The wire was then backloaded through the cystoscope and a ureteral stent was advance over the wire using Seldinger technique.  The stent was positioned appropriately under fluoroscopic and cystoscopic guidance.  The wire was then removed with an adequate stent curl noted in the renal pelvis as well as in the bladder.  The bladder  was then emptied and the procedure ended.  The patient appeared to tolerate the procedure well and without complications.  The patient was able to be awakened  and transferred to the recovery unit in satisfactory condition.   Disposition: The tether of the stent was left on and tucked inside the patient's vagina.  Instructions for removing the stent have been provided to the patient. The patient has been scheduled for followup in 6 weeks with a renal ultrasound.

## 2020-04-07 NOTE — H&P (Signed)
Acute Kidney Stone  HPI: Beverly Ford is a 44 year-old female established patient who is here for further eval and management of kidney stones.  The patient has a history of ureteroscopy in 2013. She also passed a stone in 2018. She was followed at [facility]. There she had several metabolic stone evaluations and was started on potassium citrate 20 mEq daily and hydrochlorothiazide 12.5 mg daily. Her last 24 hour urine collection demonstrated excellent control of her lithogenic profile. She was last seen in June 2021.   03/22/20: 44 year old female with a past medical history of nephrolithiasis. She was seen in the emergency department on 03/03/2020 for a left distal 5 mm obstructing stone. She followed up with Dr. Louis Meckel 2 weeks ago and continued medical expulsive therapy. Today she presents for follow-up KUB and office visit. She reports she is still having increased frequency and occasional twinges of discomfort in her left groin. She denies fevers, chills, dysuria and gross hematuria. She denies any nausea or vomiting. She has not used any of her pain medication. She is going on a trip to [place] this week and will be back in 2 weeks. We discussed ensuring she has adequate access to healthcare if needed for her stone especially if she develops any fevers or chills. She would like to pursue ureteroscopy when she returns from her vacation.   She was diagnosed with a kidney stone on approximately 03/04/2020.   Her pain started about approximately 03/03/2020. The pain is on the left side.   Abdomen/Pelvic CT: 9m left UVJ stone and small bilateral non-obstructing stones. The patient underwent CT scan prior to today's appointment.   The patient relates initially having nausea, flank pain, voiding symptoms, and groin pain. She is currently having groin pain. She denies having flank pain, back pain, nausea, vomiting, fever, chills, and voiding symptoms. She has been taking Tamsulosin, morphine,  ibuprofen. She has not caught a stone in her urine strainer since her symptoms began.   She has had Ureteroscopy for treatment of her stones in the past. This is not her first kidney stone. She has had 3 stones prior to getting this one.     ALLERGIES: Iodine    MEDICATIONS: Hydrochlorothiazide 12.5 mg tablet 1 tablet PO Daily  Potassium Citrate Er 10 meq (1,080 mg) tablet, extended release 1 tablet PO BID  Trazodone Hcl 100 mg tablet  Zoloft 100 mg tablet     GU PSH: Remove Kidney Stone - 2013       PHissopNotes: Microdisectomy (2018)   NON-GU PSH: Bilateral Tubal Ligation - 2004 Cholecystectomy (laparoscopic) - 2017 Correct Bunion - 2017 Lumbar Spine Fusion - 2019     GU PMH: Renal and ureteral calculus - 03/08/2020    NON-GU PMH: Anxiety Cardiac murmur, unspecified Depression DVT, History GERD    FAMILY HISTORY: 1 son - Son Deceased - Mother, Father Diabetes - Mother ESRD (end stage renal disease) - Father Kidney Failure - Father Lymphocytic Leukemia - Father nephrolithiasis - Father, Mother stroke - Father   SOCIAL HISTORY: Marital Status: Married Preferred Language: English; Ethnicity: Not Hispanic Or Latino Current Smoking Status: Patient has never smoked.   Tobacco Use Assessment Completed: Used Tobacco in last 30 days? Social Drinker.  Drinks 1 caffeinated drink per day. Patient's occupation iBuilding control surveyor    REVIEW OF SYSTEMS:    GU Review Female:   lower left abd pain. Patient reports frequent urination and hard to postpone urination. Patient denies burning /pain with urination,  get up at night to urinate, leakage of urine, stream starts and stops, trouble starting your stream, have to strain to urinate, and being pregnant.  Gastrointestinal (Upper):   Patient denies nausea, vomiting, and indigestion/ heartburn.  Gastrointestinal (Lower):   Patient denies diarrhea and constipation.  Constitutional:   Patient denies fever, night sweats,  weight loss, and fatigue.  Skin:   Patient denies skin rash/ lesion and itching.  Eyes:   Patient denies blurred vision and double vision.  Ears/ Nose/ Throat:   Patient denies sore throat and sinus problems.  Hematologic/Lymphatic:   Patient denies swollen glands and easy bruising.  Cardiovascular:   Patient denies leg swelling and chest pains.  Respiratory:   Patient denies cough and shortness of breath.  Endocrine:   Patient denies excessive thirst.  Musculoskeletal:   Patient denies back pain and joint pain.  Neurological:   Patient denies headaches and dizziness.  Psychologic:   Patient denies depression and anxiety.   VITAL SIGNS:      03/22/2020 08:30 AM  BP 135/86 mmHg  Pulse 65 /min  Temperature 97.7 F / 36.5 C   GU PHYSICAL EXAMINATION:      Notes: NO CVAT   MULTI-SYSTEM PHYSICAL EXAMINATION:    Constitutional: Well-nourished. No physical deformities. Normally developed. Good grooming.  Respiratory: No labored breathing, no use of accessory muscles.   Cardiovascular: Normal temperature, normal extremity pulses, no swelling, no varicosities.  Skin: No paleness, no jaundice, no cyanosis. No lesion, no ulcer, no rash.  Neurologic / Psychiatric: Oriented to time, oriented to place, oriented to person. No depression, no anxiety, no agitation.  Gastrointestinal: No mass, no tenderness, no rigidity, non obese abdomen.  Musculoskeletal: Normal gait and station of head and neck.     Complexity of Data:  Source Of History:  Patient, Medical Record Summary  Records Review:   Previous Doctor Records, Previous Hospital Records, Previous Patient Records  Urine Test Review:   Urinalysis  X-Ray Review: KUB: Reviewed Films. Reviewed Report. Discussed With Patient.  C.T. Abdomen/Pelvis: Reviewed Films. Reviewed Report. Discussed With Patient.     03/22/20  Urinalysis  Urine Appearance Clear   Urine Color Yellow   Urine Glucose Neg mg/dL  Urine Bilirubin Neg mg/dL  Urine Ketones  Neg mg/dL  Urine Specific Gravity 1.025   Urine Blood Neg ery/uL  Urine pH 5.5   Urine Protein Neg mg/dL  Urine Urobilinogen 0.2 mg/dL  Urine Nitrites Neg   Urine Leukocyte Esterase Trace leu/uL  Urine WBC/hpf 0 - 5/hpf   Urine RBC/hpf NS (Not Seen)   Urine Epithelial Cells 6 - 10/hpf   Urine Bacteria NS (Not Seen)   Urine Mucous Not Present   Urine Yeast NS (Not Seen)   Urine Trichomonas Not Present   Urine Cystals NS (Not Seen)   Urine Casts NS (Not Seen)   Urine Sperm Not Present    PROCEDURES:         KUB - 46270  A single view of the abdomen is obtained. Bilateral renal shadows are well visualized. There is a 4-5 mm opacity that corresponds with previous CT scan noted within the expected course of the distal left ureter. There are stable phleboliths within the pelvic inlet. I am unable to visualize any opacities within either renal shadow.      Patient confirmed No Neulasta OnPro Device.           Urinalysis w/Scope Dipstick Dipstick Cont'd Micro  Color: Yellow Bilirubin: Neg mg/dL WBC/hpf: 0 -  5/hpf  Appearance: Clear Ketones: Neg mg/dL RBC/hpf: NS (Not Seen)  Specific Gravity: 1.025 Blood: Neg ery/uL Bacteria: NS (Not Seen)  pH: 5.5 Protein: Neg mg/dL Cystals: NS (Not Seen)  Glucose: Neg mg/dL Urobilinogen: 0.2 mg/dL Casts: NS (Not Seen)    Nitrites: Neg Trichomonas: Not Present    Leukocyte Esterase: Trace leu/uL Mucous: Not Present      Epithelial Cells: 6 - 10/hpf      Yeast: NS (Not Seen)      Sperm: Not Present         Ketoralac 57m - [id number], [id number] area was prepped and cleaned using sterile technique. A band aid was applied. The pt tolerated well.   Qty: 60 Adm. By: EAlcide Goodness Unit: mg Lot No [id number] Route: IM Exp. Date 03/23/2021  Freq: None Mfgr.:   Site: Right Hip   ASSESSMENT:      ICD-10 Details  1 GU:   Renal and ureteral calculus - N20.2 Left, Acute, Uncomplicated   PLAN:            Medications New Meds: Ketorolac Tromethamine 10  mg tablet 1 tablet PO Q 6 H PRN   #20  0 Refill(s)            Schedule Return Visit/Planned Activity: Next Available Appointment - Schedule Surgery  [facility]: [facility] Ketoralac 634m(Toradol Per 15 Mg) - [id number]        Document Letter(s):  Created for Patient: Clinical Summary         Notes:   urinalysis is not concerning for an infectious process today. I am going to send for precautionary culture. KUB does have an opacity that correlates with the opacity in the distal left ureter on CT scan. Discussed opions in cluding ESWL, URS and continuation of MET. She would like to proceed with ureteroscopy. Risks of ureteroscopy were discussed in detail today including risk for injury to surrounding structure, risk for infection, risk for bleeding. I also discussed the general risks of anesthesia with her. She understands these risks and wished to proceed. Advised Toradol as needed over the next week and a half while she is on vacation. Advised she monitor her symptoms and continue to adequately hydrate, if she develops a fever, chills, worsening pain or discomfort advised she proceed to the nearest hospital while she is on vacation. She verbalized understanding to this. Scheduling sheet handed to scheduler.

## 2020-04-07 NOTE — Anesthesia Preprocedure Evaluation (Addendum)
Anesthesia Evaluation  Patient identified by MRN, date of birth, ID band Patient awake    Reviewed: Allergy & Precautions, NPO status , Patient's Chart, lab work & pertinent test results  Airway Mallampati: I  TM Distance: >3 FB Neck ROM: Full    Dental  (+) Teeth Intact, Dental Advisory Given   Pulmonary sleep apnea (mild on sleep study 2017, no CPAP recommended) ,    Pulmonary exam normal breath sounds clear to auscultation       Cardiovascular + DVT (2017 postop, completed a/c)  Normal cardiovascular exam Rhythm:Regular Rate:Normal     Neuro/Psych PSYCHIATRIC DISORDERS Anxiety Depression negative neurological ROS     GI/Hepatic negative GI ROS, Neg liver ROS,   Endo/Other  negative endocrine ROS  Renal/GU Renal diseaseLeft ureteral stone   negative genitourinary   Musculoskeletal negative musculoskeletal ROS (+)   Abdominal   Peds  Hematology negative hematology ROS (+)   Anesthesia Other Findings   Reproductive/Obstetrics negative OB ROS                            Anesthesia Physical Anesthesia Plan  ASA: II  Anesthesia Plan: General   Post-op Pain Management:    Induction: Intravenous  PONV Risk Score and Plan: 4 or greater and Ondansetron, Dexamethasone, Midazolam, Treatment may vary due to age or medical condition and Scopolamine patch - Pre-op  Airway Management Planned:   Additional Equipment: None  Intra-op Plan:   Post-operative Plan: Extubation in OR  Informed Consent: I have reviewed the patients History and Physical, chart, labs and discussed the procedure including the risks, benefits and alternatives for the proposed anesthesia with the patient or authorized representative who has indicated his/her understanding and acceptance.     Dental advisory given  Plan Discussed with: CRNA  Anesthesia Plan Comments:        Anesthesia Quick Evaluation

## 2020-04-08 ENCOUNTER — Encounter (HOSPITAL_BASED_OUTPATIENT_CLINIC_OR_DEPARTMENT_OTHER): Payer: Self-pay | Admitting: Urology

## 2020-04-27 ENCOUNTER — Encounter: Payer: Self-pay | Admitting: Podiatry

## 2020-04-27 ENCOUNTER — Other Ambulatory Visit: Payer: Self-pay

## 2020-04-27 ENCOUNTER — Ambulatory Visit (INDEPENDENT_AMBULATORY_CARE_PROVIDER_SITE_OTHER): Payer: Managed Care, Other (non HMO)

## 2020-04-27 ENCOUNTER — Ambulatory Visit (INDEPENDENT_AMBULATORY_CARE_PROVIDER_SITE_OTHER): Payer: Managed Care, Other (non HMO) | Admitting: Podiatry

## 2020-04-27 DIAGNOSIS — M779 Enthesopathy, unspecified: Secondary | ICD-10-CM | POA: Diagnosis not present

## 2020-04-27 DIAGNOSIS — M79672 Pain in left foot: Secondary | ICD-10-CM

## 2020-04-27 DIAGNOSIS — M778 Other enthesopathies, not elsewhere classified: Secondary | ICD-10-CM | POA: Diagnosis not present

## 2020-04-27 NOTE — Progress Notes (Signed)
Subjective:  Patient ID: Beverly Ford, female    DOB: Feb 22, 1976,  MRN: [id number]  Chief Complaint  Patient presents with  . Foot Pain    Left foot pain. PT stated that ever since surgery she has had a constant ache    45 y.o. female presents with the above complaint.  Patient presents with complaint left foot pain.  Patient states the pain is right around the previous surgery site.  She had a Lapidus with Akin osteotomy done back in 2017 and has been achy since then.  Patient states there is a constant ache there is pain with it especially when ambulating for long period of time.  She has moved away from her previous podiatrist and would like to establish a new podiatrist for treatment for this.  She has not tried anything else she has not seen anyone else prior to seeing me.  She denies any other acute complaints.   Review of Systems: Negative except as noted in the HPI. Denies N/V/F/Ch.  Past Medical History:  Diagnosis Date  . GAD (generalized anxiety disorder)   . History of diverticulitis of colon   . History of DVT of lower extremity 11/2015   per pt post op left bunion LLE dvt and completed blood thinner  . History of kidney stones   . Left ureteral stone   . MDD (major depressive disorder)   . Mild obstructive sleep apnea    per study in epic 11-08-2015,  per pt no cpap recommendation  . Nephrolithiasis    bilateral nonobstructive   . Thoracic outlet syndrome    followed by dr branch [facility] -- intermittant symptoms, but no spine related and been referred for second opinion    Current Outpatient Medications:  .  gabapentin (NEURONTIN) 300 MG capsule, TAKE ONE CAPSULE BY MOUTH TWICE A DAY (Patient not taking: No sig reported), Disp: , Rfl:  .  hydrochlorothiazide (HYDRODIURIL) 12.5 MG tablet, Take 12.5 mg by mouth daily., Disp: , Rfl:  .  ipratropium (ATROVENT) 0.06 % nasal spray, Place 2 sprays into both nostrils 3 (three) times daily. (Patient taking  differently: Place 2 sprays into both nostrils 3 (three) times daily as needed.), Disp: 15 mL, Rfl: 1 .  levocetirizine (XYZAL) 5 MG tablet, Take 1 tablet (5 mg total) by mouth every evening., Disp: 30 tablet, Rfl: 0 .  ondansetron (ZOFRAN ODT) 4 MG disintegrating tablet, 4mg  ODT q4 hours prn nausea/vomit, Disp: 20 tablet, Rfl: 0 .  phenazopyridine (PYRIDIUM) 200 MG tablet, Take 1 tablet (200 mg total) by mouth 3 (three) times daily as needed for pain., Disp: 10 tablet, Rfl: 0 .  potassium chloride (KLOR-CON) 10 MEQ tablet, Take 10 mEq by mouth 2 (two) times daily., Disp: , Rfl:  .  sertraline (ZOLOFT) 100 MG tablet, Take 100 mg by mouth at bedtime., Disp: , Rfl:  .  tamsulosin (FLOMAX) 0.4 MG CAPS capsule, Take 1 capsule (0.4 mg total) by mouth daily after supper. Take until the stent is removed., Disp: , Rfl:  .  traMADol (ULTRAM) 50 MG tablet, Take 1-2 tablets (50-100 mg total) by mouth every 6 (six) hours as needed for moderate pain., Disp: 15 tablet, Rfl: 0 .  traZODone (DESYREL) 50 MG tablet, Take 50 mg by mouth at bedtime., Disp: , Rfl:   Social History   Tobacco Use  Smoking Status Never Smoker  Smokeless Tobacco Never Used    Allergies  Allergen Reactions  . Betadine [Povidone Iodine] Hives  blisters  . Shellfish Allergy Hives    All shellfish   Objective:  There were no vitals filed for this visit. There is no height or weight on file to calculate BMI. Constitutional Well developed. Well nourished.  Vascular Dorsalis pedis pulses palpable bilaterally. Posterior tibial pulses palpable bilaterally. Capillary refill normal to all digits.  No cyanosis or clubbing noted. Pedal hair growth normal.  Neurologic Normal speech. Oriented to person, place, and time. Epicritic sensation to light touch grossly present bilaterally.  Dermatologic Nails well groomed and normal in appearance. No open wounds. No skin lesions.  Orthopedic:  Pain on palpation to the left first  metatarsophalangeal joint as well as left first tarsometatarsal joint at the site of previous hardware.  Palpable hardware noted as well.  No signs of wound or ulceration secondary to hardware noted.  Pain with range of motion of the first metatarsophalangeal joint mildly no intra-articular pain noted.   Radiographs: 3 views of skeletally mature adult left foot: Hardware noted appears to be within normal limits with mild backing out of the lag screw.  It also appears that the distal screws of the plate seems to be longer in length and the metatarsal that could also be attributed to plantar pain as well.  No gross deformation of the plate and screws/hardware noted.  No other bony abnormalities noted.  Good correction alignment noted.  Good consolidation/fusion of the first tarsometatarsal joint noted. Assessment:   1. Left foot pain   2. Capsulitis of left foot    Plan:  Patient was evaluated and treated and all questions answered.  Left painful hardware versus capsulitis of the first MTPJ joint -I explained the patient the etiology of capsulitis and various treatment options were discussed.  At this time it is hard to differentiate between hardware pain as well as inflammation from general MPJ pain.  At this time I believe patient will benefit from a steroid injection to rule out inflammation secondary to capsulitis.  If there is no improvement we will discuss hardware removal during next clinical visit.  Also I have also asked the patient to obtain previous records to know what hardware is in place.  She states understanding and will do so. -A steroid injection was performed at left first metatarsophalangeal joint using 1% plain Lidocaine and 10 mg of Kenalog. This was well tolerated.    No follow-ups on file.

## 2020-05-02 ENCOUNTER — Other Ambulatory Visit: Payer: Self-pay | Admitting: Podiatry

## 2020-05-02 DIAGNOSIS — M779 Enthesopathy, unspecified: Secondary | ICD-10-CM

## 2020-05-25 ENCOUNTER — Ambulatory Visit: Payer: Managed Care, Other (non HMO) | Admitting: Podiatry

## 2020-05-25 ENCOUNTER — Encounter: Payer: Self-pay | Admitting: Podiatry

## 2020-05-25 ENCOUNTER — Other Ambulatory Visit: Payer: Self-pay

## 2020-05-25 DIAGNOSIS — M722 Plantar fascial fibromatosis: Secondary | ICD-10-CM | POA: Diagnosis not present

## 2020-05-25 DIAGNOSIS — M778 Other enthesopathies, not elsewhere classified: Secondary | ICD-10-CM

## 2020-05-25 DIAGNOSIS — G5762 Lesion of plantar nerve, left lower limb: Secondary | ICD-10-CM | POA: Diagnosis not present

## 2020-05-26 NOTE — Progress Notes (Signed)
Subjective:  Patient ID: Beverly Ford, female    DOB: Oct 06, 1975,  MRN: [id number]  Chief Complaint  Patient presents with  . Foot Pain    Follow up capsulitis left   "Its not any better, its actually worse"    45 y.o. female presents with the above complaint.  Patient presents with a follow-up of left foot pain.  Patient states that the injection did not help that I gave her last time.  She states the boot has been feeling okay however she still has about the same pain.  She states that it is not any better and is actually worse.  She would like to discuss what the next steps will be.  She was unable to get the records for her previous hardware which I am not sure is actually the cause of her pain at the current moment.  She complains of more up in her first interspace pain and circumferentially around the first MPJ plantar side pain.  She denies any other acute complaints   Review of Systems: Negative except as noted in the HPI. Denies N/V/F/Ch.  Past Medical History:  Diagnosis Date  . GAD (generalized anxiety disorder)   . History of diverticulitis of colon   . History of DVT of lower extremity 11/2015   per pt post op left bunion LLE dvt and completed blood thinner  . History of kidney stones   . Left ureteral stone   . MDD (major depressive disorder)   . Mild obstructive sleep apnea    per study in epic 11-08-2015,  per pt no cpap recommendation  . Nephrolithiasis    bilateral nonobstructive   . Thoracic outlet syndrome    followed by dr branch @ [facility] -- intermittant symptoms, but no spine related and been referred for second opinion    Current Outpatient Medications:  .  gabapentin (NEURONTIN) 300 MG capsule, TAKE ONE CAPSULE BY MOUTH TWICE A DAY (Patient not taking: No sig reported), Disp: , Rfl:  .  hydrochlorothiazide (HYDRODIURIL) 12.5 MG tablet, Take 12.5 mg by mouth daily., Disp: , Rfl:  .  ipratropium (ATROVENT) 0.06 % nasal spray, Place 2 sprays into both  nostrils 3 (three) times daily. (Patient taking differently: Place 2 sprays into both nostrils 3 (three) times daily as needed.), Disp: 15 mL, Rfl: 1 .  ketorolac (TORADOL) 10 MG tablet, Take 10 mg by mouth every 6 (six) hours as needed., Disp: , Rfl:  .  levocetirizine (XYZAL) 5 MG tablet, Take 1 tablet (5 mg total) by mouth every evening., Disp: 30 tablet, Rfl: 0 .  methocarbamol (ROBAXIN) 500 MG tablet, Take 500 mg by mouth 3 (three) times daily., Disp: , Rfl:  .  ondansetron (ZOFRAN ODT) 4 MG disintegrating tablet, 4mg  ODT q4 hours prn nausea/vomit, Disp: 20 tablet, Rfl: 0 .  phenazopyridine (PYRIDIUM) 200 MG tablet, Take 1 tablet (200 mg total) by mouth 3 (three) times daily as needed for pain., Disp: 10 tablet, Rfl: 0 .  potassium chloride (KLOR-CON) 10 MEQ tablet, Take 10 mEq by mouth 2 (two) times daily., Disp: , Rfl:  .  sertraline (ZOLOFT) 100 MG tablet, Take 100 mg by mouth at bedtime., Disp: , Rfl:  .  tamsulosin (FLOMAX) 0.4 MG CAPS capsule, Take 1 capsule (0.4 mg total) by mouth daily after supper. Take until the stent is removed., Disp: , Rfl:  .  traMADol (ULTRAM) 50 MG tablet, Take 1-2 tablets (50-100 mg total) by mouth every 6 (six) hours as needed  for moderate pain., Disp: 15 tablet, Rfl: 0 .  traZODone (DESYREL) 50 MG tablet, Take 50 mg by mouth at bedtime., Disp: , Rfl:   Social History   Tobacco Use  Smoking Status Never Smoker  Smokeless Tobacco Never Used    Allergies  Allergen Reactions  . Other     Other reaction(s): Other IVP dye  Unknown reaction IVP dye  Unknown reaction   . Betadine [Povidone Iodine] Hives    blisters  . Iodinated Diagnostic Agents     Other reaction(s): Other (See Comments) IVP dye  Unknown reaction  . Shellfish Allergy Hives    All shellfish  . Topiramate     Other reaction(s): Other kidney stones kidney stones    Objective:  There were no vitals filed for this visit. There is no height or weight on file to calculate  BMI. Constitutional Well developed. Well nourished.  Vascular Dorsalis pedis pulses palpable bilaterally. Posterior tibial pulses palpable bilaterally. Capillary refill normal to all digits.  No cyanosis or clubbing noted. Pedal hair growth normal.  Neurologic Normal speech. Oriented to person, place, and time. Epicritic sensation to light touch grossly present bilaterally.  Dermatologic Nails well groomed and normal in appearance. No open wounds. No skin lesions.  Orthopedic:  Pain on palpation to the left first metatarsophalangeal joint.  No pain at the first tarsometatarsal joint of the hardware.  Palpable hardware noted as well.  No signs of wound or ulceration secondary to hardware noted.  Pain on palpation to the plantar aspect of the first metatarsophalangeal joint at the insertion of type plantar fascia.   Radiographs: 3 views of skeletally mature adult left foot: Hardware noted appears to be within normal limits with mild backing out of the lag screw.  It also appears that the distal screws of the plate seems to be longer in length and the metatarsal that could also be attributed to plantar pain as well.  No gross deformation of the plate and screws/hardware noted.  No other bony abnormalities noted.  Good correction alignment noted.  Good consolidation/fusion of the first tarsometatarsal joint noted. Assessment:   1. Capsulitis of left foot   2. Morton neuroma, left   3. Plantar fasciitis, left    Plan:  Patient was evaluated and treated and all questions answered.  Left painful hardware versus capsulitis of the first MTPJ joint -I explained the patient the etiology of capsulitis and various treatment options were discussed.  At this time it is hard to differentiate between hardware pain as well as inflammation from general MPJ pain.  Given that clinically the steroid injection did not help I will hold off on any further steroid injection for now.  Left first interspace neuroma  versus plantar fasciitis distal band -Explained the patient the etiology of neuroma and various treatment options were discussed.  Given that she still continues to have about the same pain as progressive gotten worse.  I believe patient will benefit from an MRI evaluation to assess the neuroma in the first interspace as well as a possible plantar fascial tear at the insertion into the first metatarsophalangeal joint. -Patient states understand would like to obtain an MRI.  MRI was ordered -Continue wearing the cam boot for now.   No follow-ups on file.

## 2020-05-31 ENCOUNTER — Encounter: Payer: Self-pay | Admitting: Podiatry

## 2020-06-06 ENCOUNTER — Other Ambulatory Visit: Payer: Self-pay

## 2020-06-06 ENCOUNTER — Ambulatory Visit
Admission: RE | Admit: 2020-06-06 | Discharge: 2020-06-06 | Disposition: A | Discharge status: Home / Self Care | Payer: Managed Care, Other (non HMO) | Source: Ambulatory Visit | Attending: Podiatry | Admitting: Podiatry

## 2020-06-06 DIAGNOSIS — G5762 Lesion of plantar nerve, left lower limb: Secondary | ICD-10-CM

## 2020-06-06 DIAGNOSIS — M722 Plantar fascial fibromatosis: Secondary | ICD-10-CM

## 2020-06-15 ENCOUNTER — Other Ambulatory Visit: Payer: Self-pay

## 2020-06-15 ENCOUNTER — Emergency Department (INDEPENDENT_AMBULATORY_CARE_PROVIDER_SITE_OTHER)
Admission: RE | Admit: 2020-06-15 | Discharge: 2020-06-15 | Disposition: A | Discharge status: Home / Self Care | Payer: Managed Care, Other (non HMO) | Source: Ambulatory Visit

## 2020-06-15 VITALS — BP 127/73 | HR 67 | Temp 98.3°F | Resp 15

## 2020-06-15 DIAGNOSIS — R21 Rash and other nonspecific skin eruption: Secondary | ICD-10-CM

## 2020-06-15 MED ORDER — PREDNISONE 20 MG PO TABS: 40.0000 mg | ORAL_TABLET | Freq: Every day | ORAL | 0 refills | Status: DC

## 2020-06-15 MED ORDER — TRIAMCINOLONE ACETONIDE 0.025 % EX OINT: 1.0000 "application " | TOPICAL_OINTMENT | Freq: Two times a day (BID) | CUTANEOUS | 0 refills | Status: DC

## 2020-06-15 NOTE — Discharge Instructions (Addendum)
Complete entire course of prednisone.  I have prescribed a topical steroid cream this is more to acutely help manage the itching. Rash appearance, given the distribution ,does not appear to be that of shingles however if you notice any worsening of rash or blistering lesions are nonresponsive to the treatment I have prescribed today return for evaluation.

## 2020-06-15 NOTE — ED Provider Notes (Signed)
[facility]    CSN: [id number] Arrival date & time: 06/15/20  1535      History   Chief Complaint Chief Complaint  Patient presents with  . Rash    HPI Beverly Ford is a 45 y.o. female.   HPI Patient presents today for evaluation of a rash.  Rash developed on her left chest wall below her breast and over the last 2 days has restricted to her left side with scant appearances of rash on her back.  She also endorses a spot on her left upper neck.  She denies any known contact with any irritants.  No one else in the home has similar patterns of rash.  The rash is now itchy and burning.  She was concerned for shingles. Past Medical History:  Diagnosis Date  . GAD (generalized anxiety disorder)   . History of diverticulitis of colon   . History of DVT of lower extremity 11/2015   per pt post op left bunion LLE dvt and completed blood thinner  . History of kidney stones   . Left ureteral stone   . MDD (major depressive disorder)   . Mild obstructive sleep apnea    per study in epic 11-08-2015,  per pt no cpap recommendation  . Nephrolithiasis    bilateral nonobstructive   . Thoracic outlet syndrome    followed by dr branch @ [facility] -- intermittant symptoms, but no spine related and been referred for second opinion    There are no problems to display for this patient.   Past Surgical History:  Procedure Laterality Date  . BUNIONECTOMY Left 12/08/2015  . CYSTOSCOPY/URETEROSCOPY/HOLMIUM LASER/STENT PLACEMENT Left 04/07/2020   Procedure: CYSTOSCOPY LEFT RETROGRADE PYELOGRAM URETEROSCOPY/HOLMIUM LASER/STENT PLACEMENT;  Surgeon: 04/09/2020, MD;  Location: [facility];  Service: Urology;  Laterality: Left;  . LAPAROSCOPIC CHOLECYSTECTOMY  07/2015  . LUMBAR DISC SURGERY  10/05/2016   L5-- S1  . LUMBAR FUSION  01/24/2018   L5-- S1  . NASAL SEPTUM SURGERY  2006  . TUBAL LIGATION Bilateral 2004  . URETEROSCOPY WITH HOLMIUM LASER  LITHOTRIPSY  2013    OB History   No obstetric history on file.      Home Medications    Prior to Admission medications   Medication Sig Start Date End Date Taking? Authorizing Provider  predniSONE (DELTASONE) 20 MG tablet Take 2 tablets (40 mg total) by mouth daily with breakfast. 06/15/20  Yes 06/17/20, FNP  triamcinolone (KENALOG) 0.025 % ointment Apply 1 application topically 2 (two) times daily. 06/15/20  Yes 06/17/20, FNP  gabapentin (NEURONTIN) 300 MG capsule TAKE ONE CAPSULE BY MOUTH TWICE A DAY Patient not taking: No sig reported 10/07/18   [provider]  hydrochlorothiazide (HYDRODIURIL) 12.5 MG tablet Take 12.5 mg by mouth daily. 05/21/18 04/07/20  [provider]  ipratropium (ATROVENT) 0.06 % nasal spray Place 2 sprays into both nostrils 3 (three) times daily. Patient taking differently: Place 2 sprays into both nostrils 3 (three) times daily as needed. 04/27/19   06/25/19, FNP  ketorolac (TORADOL) 10 MG tablet Take 10 mg by mouth every 6 (six) hours as needed. 03/22/20   [provider]  levocetirizine (XYZAL) 5 MG tablet Take 1 tablet (5 mg total) by mouth every evening. 04/27/19   06/25/19, FNP  methocarbamol (ROBAXIN) 500 MG tablet Take 500 mg by mouth 3 (three) times daily. 01/01/20   [provider]  ondansetron [facility]  ODT) 4 MG disintegrating tablet 4mg  ODT q4 hours prn nausea/vomit 03/03/20   13/11/21, DO  phenazopyridine (PYRIDIUM) 200 MG tablet Take 1 tablet (200 mg total) by mouth 3 (three) times daily as needed for pain. 04/07/20   04/09/20, MD  potassium chloride (KLOR-CON) 10 MEQ tablet Take 10 mEq by mouth 2 (two) times daily.    [provider]  sertraline (ZOLOFT) 100 MG tablet Take 100 mg by mouth at bedtime.    [provider]  tamsulosin (FLOMAX) 0.4 MG CAPS capsule Take 1 capsule (0.4 mg total) by mouth daily after supper. Take until the stent is removed.  04/07/20   04/09/20, MD  traMADol (ULTRAM) 50 MG tablet Take 1-2 tablets (50-100 mg total) by mouth every 6 (six) hours as needed for moderate pain. 04/07/20   04/09/20, MD  traZODone (DESYREL) 50 MG tablet Take 50 mg by mouth at bedtime. 04/09/19 04/07/20  [provider]    Family History Family History  Problem Relation Age of Onset  . Diabetes Mother   . Cancer Father   . Kidney disease Father   . Hypertension Father     Social History Social History   Tobacco Use  . Smoking status: Never Smoker  . Smokeless tobacco: Never Used  Vaping Use  . Vaping Use: Never used  Substance Use Topics  . Alcohol use: Not Currently    Comment: social  . Drug use: Never     Allergies   Other, Betadine [povidone iodine], Iodinated diagnostic agents, Shellfish allergy, and Topiramate   Review of Systems Review of Systems .Pertinent negatives listed in HPI Physical Exam Triage Vital Signs ED Triage Vitals  Enc Vitals Group     BP 06/15/20 1545 127/73     Pulse Rate 06/15/20 1545 67     Resp 06/15/20 1545 15     Temp 06/15/20 1545 98.3 F (36.8 C)     Temp Source 06/15/20 1545 Oral     SpO2 06/15/20 1545 98 %     Weight --      Height --      Head Circumference --      Peak Flow --      Pain Score 06/15/20 1543 0     Pain Loc --      Pain Edu? --      Excl. in GC? --    No data found.  Updated Vital Signs BP 127/73 (BP Location: Right Arm)   Pulse 67   Temp 98.3 F (36.8 C) (Oral)   Resp 15   SpO2 98%   Visual Acuity Right Eye Distance:   Left Eye Distance:   Bilateral Distance:    Right Eye Near:   Left Eye Near:    Bilateral Near:     Physical Exam Constitutional:      Appearance: Normal appearance.  Cardiovascular:     Rate and Rhythm: Normal rate and regular rhythm.  Pulmonary:     Effort: Pulmonary effort is normal.     Breath sounds: Normal breath sounds.  Skin:      Neurological:     Mental Status: She is  alert.  Psychiatric:        Attention and Perception: Attention normal.        Mood and Affect: Mood normal.        Speech: Speech normal.      UC Treatments / Results  Labs (all labs ordered are  listed, but only abnormal results are displayed) Labs Reviewed - No data to display  EKG   Radiology No results found.  Procedures Procedures (including critical care time)  Medications Ordered in UC Medications - No data to display  Initial Impression / Assessment and Plan / UC Course  I have reviewed the triage vital signs and the nursing notes.  Pertinent labs & imaging results that were available during my care of the patient were reviewed by me and considered in my medical decision making (see chart for details).    Treating as contact dermatitis, however, cannot rule out shingles, however distribution pattern and papules are vesicular appearing. Treatment per discharge medications and instructions. Return precautions given. Final Clinical Impressions(s) / UC Diagnoses   Final diagnoses:  Rash and nonspecific skin eruption     Discharge Instructions     Complete entire course of prednisone.  I have prescribed a topical steroid cream this is more to acutely help manage the itching. Rash appearance, given the distribution ,does not appear to be that of shingles however if you notice any worsening of rash or blistering lesions are nonresponsive to the treatment I have prescribed today return for evaluation.   ED Prescriptions    Medication Sig Dispense Auth. Provider   predniSONE (DELTASONE) 20 MG tablet Take 2 tablets (40 mg total) by mouth daily with breakfast. 10 tablet Bing Neighbors, FNP   triamcinolone (KENALOG) 0.025 % ointment Apply 1 application topically 2 (two) times daily. 160 g Bing Neighbors, FNP     PDMP not reviewed this encounter.   Bing Neighbors, FNP 06/17/20 1258

## 2020-06-15 NOTE — ED Triage Notes (Signed)
Patient presents to Urgent Care with complaints of rash on left side of chest since 3 days ago. Patient reports it itches and burns, is concerned that it might be shingles.

## 2020-06-22 ENCOUNTER — Ambulatory Visit: Payer: Managed Care, Other (non HMO) | Admitting: Podiatry

## 2020-06-22 ENCOUNTER — Other Ambulatory Visit: Payer: Self-pay

## 2020-06-22 ENCOUNTER — Encounter: Payer: Self-pay | Admitting: Podiatry

## 2020-06-22 DIAGNOSIS — G5762 Lesion of plantar nerve, left lower limb: Secondary | ICD-10-CM

## 2020-06-22 DIAGNOSIS — T85848A Pain due to other internal prosthetic devices, implants and grafts, initial encounter: Secondary | ICD-10-CM

## 2020-06-22 NOTE — Progress Notes (Signed)
Subjective:  Patient ID: Beverly Ford, female    DOB: [birth year],  MRN: [id number]  Chief Complaint  Patient presents with  . Foot Pain    Left foot pain. PT stated that there has been no change.    45 y.o. female presents with the above complaint.  Patient presents with a follow-up of left foot pain.  Patient states that the injection did not help that I gave her last time.  She states the boot has been feeling okay however she still has about the same pain.  She states that it is not any better and is actually worse.  She would like to discuss what the next steps will be.  She was unable to get the records for her previous hardware which I am not sure is actually the cause of her pain at the current moment.  She complains of more up in her first interspace pain and circumferentially around the first MPJ plantar side pain.  She denies any other acute complaints   Review of Systems: Negative except as noted in the HPI. Denies N/V/F/Ch.  Past Medical History:  Diagnosis Date  . GAD (generalized anxiety disorder)   . History of diverticulitis of colon   . History of DVT of lower extremity 11/2015   per pt post op left bunion LLE dvt and completed blood thinner  . History of kidney stones   . Left ureteral stone   . MDD (major depressive disorder)   . Mild obstructive sleep apnea    per study in epic 11-08-2015,  per pt no cpap recommendation  . Nephrolithiasis    bilateral nonobstructive   . Thoracic outlet syndrome    followed by dr branch @ [facility] -- intermittant symptoms, but no spine related and been referred for second opinion    Current Outpatient Medications:  .  gabapentin (NEURONTIN) 300 MG capsule, TAKE ONE CAPSULE BY MOUTH TWICE A DAY (Patient not taking: No sig reported), Disp: , Rfl:  .  hydrochlorothiazide (HYDRODIURIL) 12.5 MG tablet, Take 12.5 mg by mouth daily., Disp: , Rfl:  .  ipratropium (ATROVENT) 0.06 % nasal spray, Place 2 sprays into both nostrils 3  (three) times daily. (Patient taking differently: Place 2 sprays into both nostrils 3 (three) times daily as needed.), Disp: 15 mL, Rfl: 1 .  ketorolac (TORADOL) 10 MG tablet, Take 10 mg by mouth every 6 (six) hours as needed., Disp: , Rfl:  .  levocetirizine (XYZAL) 5 MG tablet, Take 1 tablet (5 mg total) by mouth every evening., Disp: 30 tablet, Rfl: 0 .  methocarbamol (ROBAXIN) 500 MG tablet, Take 500 mg by mouth 3 (three) times daily., Disp: , Rfl:  .  ondansetron (ZOFRAN ODT) 4 MG disintegrating tablet, 4mg  ODT q4 hours prn nausea/vomit, Disp: 20 tablet, Rfl: 0 .  phenazopyridine (PYRIDIUM) 200 MG tablet, Take 1 tablet (200 mg total) by mouth 3 (three) times daily as needed for pain., Disp: 10 tablet, Rfl: 0 .  potassium chloride (KLOR-CON) 10 MEQ tablet, Take 10 mEq by mouth 2 (two) times daily., Disp: , Rfl:  .  predniSONE (DELTASONE) 20 MG tablet, Take 2 tablets (40 mg total) by mouth daily with breakfast., Disp: 10 tablet, Rfl: 0 .  sertraline (ZOLOFT) 100 MG tablet, Take 100 mg by mouth at bedtime., Disp: , Rfl:  .  tamsulosin (FLOMAX) 0.4 MG CAPS capsule, Take 1 capsule (0.4 mg total) by mouth daily after supper. Take until the stent is removed., Disp: , Rfl:  .  traMADol (ULTRAM) 50 MG tablet, Take 1-2 tablets (50-100 mg total) by mouth every 6 (six) hours as needed for moderate pain., Disp: 15 tablet, Rfl: 0 .  traZODone (DESYREL) 50 MG tablet, Take 50 mg by mouth at bedtime., Disp: , Rfl:  .  triamcinolone (KENALOG) 0.025 % ointment, Apply 1 application topically 2 (two) times daily., Disp: 160 g, Rfl: 0  Social History   Tobacco Use  Smoking Status Never Smoker  Smokeless Tobacco Never Used    Allergies  Allergen Reactions  . Other     Other reaction(s): Other IVP dye  Unknown reaction IVP dye  Unknown reaction   . Betadine [Povidone Iodine] Hives    blisters  . Iodinated Diagnostic Agents     Other reaction(s): Other (See Comments) IVP dye  Unknown reaction  .  Shellfish Allergy Hives    All shellfish  . Topiramate     Other reaction(s): Other kidney stones kidney stones    Objective:  There were no vitals filed for this visit. There is no height or weight on file to calculate BMI. Constitutional Well developed. Well nourished.  Vascular Dorsalis pedis pulses palpable bilaterally. Posterior tibial pulses palpable bilaterally. Capillary refill normal to all digits.  No cyanosis or clubbing noted. Pedal hair growth normal.  Neurologic Normal speech. Oriented to person, place, and time. Epicritic sensation to light touch grossly present bilaterally.  Dermatologic Nails well groomed and normal in appearance. No open wounds. No skin lesions.  Orthopedic:  Pain on palpation to the left first metatarsophalangeal joint.  No pain at the first tarsometatarsal joint of the hardware.  Palpable hardware noted as well.  No signs of wound or ulceration secondary to hardware noted.  Pain on palpation to the plantar aspect of the first metatarsophalangeal joint at the insertion of type plantar fascia.   Radiographs: 3 views of skeletally mature adult left foot: Hardware noted appears to be within normal limits with mild backing out of the lag screw.  It also appears that the distal screws of the plate seems to be longer in length and the metatarsal that could also be attributed to plantar pain as well.  No gross deformation of the plate and screws/hardware noted.  No other bony abnormalities noted.  Good correction alignment noted.  Good consolidation/fusion of the first tarsometatarsal joint noted. Assessment:   1. Pain from implanted hardware, initial encounter   2. Morton neuroma, left    Plan:  Patient was evaluated and treated and all questions answered.  Left painful hardware versus capsulitis of the first MTPJ joint -At this time patient pain was more focalized to local orthopedic hardware pain. Patient states that given her shoes when she  ambulates clinically rubs against the hardware inside the foot and leads to her pain and therefore she is unable to wear tennis shoes which are supportive. This could indirectly lead to other foot and ankle issues that she is experiencing especially in the left first interspace. At this time I will focus on primarily removing the hardware assuming that there is consolidation present at the first tarsometatarsal joint. I will obtain a CT scan to evaluate the consolidation of the first tarsometatarsal joint.  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  Given that she still continues to have about the same pain as progressive gotten worse.  I believe patient will benefit from an MRI evaluation to assess the neuroma  in the first interspace as well as a possible plantar fascial tear at the insertion into the first metatarsophalangeal joint. -Patient states understand would like to obtain an MRI.  MRI was ordered -Continue wearing the cam boot for now.   No follow-ups on file.

## 2020-06-24 ENCOUNTER — Other Ambulatory Visit (HOSPITAL_COMMUNITY)
Admission: RE | Admit: 2020-06-24 | Discharge: 2020-06-24 | Disposition: A | Discharge status: Home / Self Care | Payer: Managed Care, Other (non HMO) | Source: Ambulatory Visit | Attending: Family Medicine | Admitting: Family Medicine

## 2020-06-24 ENCOUNTER — Other Ambulatory Visit: Payer: Self-pay | Admitting: Family Medicine

## 2020-06-24 DIAGNOSIS — Z01411 Encounter for gynecological examination (general) (routine) with abnormal findings: Secondary | ICD-10-CM | POA: Insufficient documentation

## 2020-06-28 LAB — CYTOLOGY - PAP
Comment: NEGATIVE
Diagnosis: NEGATIVE
High risk HPV: NEGATIVE

## 2020-07-02 ENCOUNTER — Ambulatory Visit
Admission: RE | Admit: 2020-07-02 | Discharge: 2020-07-02 | Disposition: A | Discharge status: Home / Self Care | Payer: Managed Care, Other (non HMO) | Source: Ambulatory Visit | Attending: Podiatry | Admitting: Podiatry

## 2020-07-02 DIAGNOSIS — G5762 Lesion of plantar nerve, left lower limb: Secondary | ICD-10-CM

## 2020-07-02 DIAGNOSIS — T85848A Pain due to other internal prosthetic devices, implants and grafts, initial encounter: Secondary | ICD-10-CM

## 2020-07-15 ENCOUNTER — Ambulatory Visit: Payer: Managed Care, Other (non HMO) | Admitting: Podiatry

## 2020-07-15 ENCOUNTER — Other Ambulatory Visit: Payer: Self-pay

## 2020-07-15 DIAGNOSIS — Z01818 Encounter for other preprocedural examination: Secondary | ICD-10-CM | POA: Diagnosis not present

## 2020-07-15 DIAGNOSIS — T85848A Pain due to other internal prosthetic devices, implants and grafts, initial encounter: Secondary | ICD-10-CM | POA: Diagnosis not present

## 2020-07-19 ENCOUNTER — Encounter: Payer: Self-pay | Admitting: Podiatry

## 2020-07-19 NOTE — Progress Notes (Signed)
Subjective:  Patient ID: Beverly Ford, female    DOB: 29-Dec-1975,  MRN: [id number]  Chief Complaint  Patient presents with  . Foot Pain    Left foot pain. Pt states pain is still the same and believes that it is the plates and screws that are causing the pain.     45 y.o. female presents with the above complaint.  Patient presents with follow-up of continuous left hardware pain.  Patient states that it definitely is the plates and screws is causing her a lot of pain.  She would like to discuss removal of it.  She has failed all conservative treatment options.  She states it hurts to ambulate on it.  t.  She denies any other acute complaints.  After reviewing her prior notes patient has Synthes hardware placed in her foot.  She denies any other acute complaints.   Review of Systems: Negative except as noted in the HPI. Denies N/V/F/Ch.  Past Medical History:  Diagnosis Date  . GAD (generalized anxiety disorder)   . History of diverticulitis of colon   . History of DVT of lower extremity 11/2015   per pt post op left bunion LLE dvt and completed blood thinner  . History of kidney stones   . Left ureteral stone   . MDD (major depressive disorder)   . Mild obstructive sleep apnea    per study in epic 11-08-2015,  per pt no cpap recommendation  . Nephrolithiasis    bilateral nonobstructive   . Thoracic outlet syndrome    followed by dr branch @ [facility] -- intermittant symptoms, but no spine related and been referred for second opinion    Current Outpatient Medications:  .  gabapentin (NEURONTIN) 300 MG capsule, TAKE ONE CAPSULE BY MOUTH TWICE A DAY (Patient not taking: No sig reported), Disp: , Rfl:  .  hydrochlorothiazide (HYDRODIURIL) 12.5 MG tablet, Take 12.5 mg by mouth daily., Disp: , Rfl:  .  ipratropium (ATROVENT) 0.06 % nasal spray, Place 2 sprays into both nostrils 3 (three) times daily. (Patient taking differently: Place 2 sprays into both nostrils 3 (three) times  daily as needed.), Disp: 15 mL, Rfl: 1 .  ketorolac (TORADOL) 10 MG tablet, Take 10 mg by mouth every 6 (six) hours as needed., Disp: , Rfl:  .  levocetirizine (XYZAL) 5 MG tablet, Take 1 tablet (5 mg total) by mouth every evening., Disp: 30 tablet, Rfl: 0 .  methocarbamol (ROBAXIN) 500 MG tablet, Take 500 mg by mouth 3 (three) times daily., Disp: , Rfl:  .  ondansetron (ZOFRAN ODT) 4 MG disintegrating tablet, 4mg  ODT q4 hours prn nausea/vomit, Disp: 20 tablet, Rfl: 0 .  phenazopyridine (PYRIDIUM) 200 MG tablet, Take 1 tablet (200 mg total) by mouth 3 (three) times daily as needed for pain., Disp: 10 tablet, Rfl: 0 .  potassium chloride (KLOR-CON) 10 MEQ tablet, Take 10 mEq by mouth 2 (two) times daily., Disp: , Rfl:  .  predniSONE (DELTASONE) 20 MG tablet, Take 2 tablets (40 mg total) by mouth daily with breakfast., Disp: 10 tablet, Rfl: 0 .  sertraline (ZOLOFT) 100 MG tablet, Take 100 mg by mouth at bedtime., Disp: , Rfl:  .  tamsulosin (FLOMAX) 0.4 MG CAPS capsule, Take 1 capsule (0.4 mg total) by mouth daily after supper. Take until the stent is removed., Disp: , Rfl:  .  traMADol (ULTRAM) 50 MG tablet, Take 1-2 tablets (50-100 mg total) by mouth every 6 (six) hours as needed for moderate pain.,  Disp: 15 tablet, Rfl: 0 .  traZODone (DESYREL) 50 MG tablet, Take 50 mg by mouth at bedtime., Disp: , Rfl:  .  triamcinolone (KENALOG) 0.025 % ointment, Apply 1 application topically 2 (two) times daily., Disp: 160 g, Rfl: 0  Social History   Tobacco Use  Smoking Status Never Smoker  Smokeless Tobacco Never Used    Allergies  Allergen Reactions  . Other     Other reaction(s): Other IVP dye  Unknown reaction IVP dye  Unknown reaction   . Betadine [Povidone Iodine] Hives    blisters  . Iodinated Diagnostic Agents     Other reaction(s): Other (See Comments) IVP dye  Unknown reaction  . Shellfish Allergy Hives    All shellfish  . Topiramate     Other reaction(s): Other kidney  stones kidney stones    Objective:  There were no vitals filed for this visit. There is no height or weight on file to calculate BMI. Constitutional Well developed. Well nourished.  Vascular Dorsalis pedis pulses palpable bilaterally. Posterior tibial pulses palpable bilaterally. Capillary refill normal to all digits.  No cyanosis or clubbing noted. Pedal hair growth normal.  Neurologic Normal speech. Oriented to person, place, and time. Epicritic sensation to light touch grossly present bilaterally.  Dermatologic Nails well groomed and normal in appearance. No open wounds. No skin lesions.  Orthopedic:  Pain on palpation to the left first metatarsophalangeal joint.  No pain at the first tarsometatarsal joint of the hardware.  Palpable hardware noted as well.  No signs of wound or ulceration secondary to hardware noted.  Pain on palpation to the plantar aspect of the first metatarsophalangeal joint at the insertion of type plantar fascia.   Radiographs: 3 views of skeletally mature adult left foot: Hardware noted appears to be within normal limits with mild backing out of the lag screw.  It also appears that the distal screws of the plate seems to be longer in length and the metatarsal that could also be attributed to plantar pain as well.  No gross deformation of the plate and screws/hardware noted.  No other bony abnormalities noted.  Good correction alignment noted.  Good consolidation/fusion of the first tarsometatarsal joint noted. Assessment:   1. Pain from implanted hardware, initial encounter   2. Preoperative examination    Plan:  Patient was evaluated and treated and all questions answered.  Left painful hardware versus capsulitis of the first MTPJ joint -Clinically at this time patient's primary pain is appears to be with the hardware.  All of her hardware side is hurting her including the first tarsometatarsal joint as well as the Akin osteotomy screw.  It appears that  patient has placed Synthes hardware after reviewing her prior charts from 2017.  At this time I believe patient will benefit from surgical removal of all hardware to rule out her continuous pain.  Patient agrees with the plan would like to proceed with the surgery.  My goal is to remove all orthopedic painful hardware. -CT scan as well as MRIs were reviewed with the patient in extensive detail.  It appears that there is no acute soft tissue or bony correlation noted.  Patient has achieved arthrodesis of the first tarsometatarsal joint therefore patient does not need hardware any longer given that is still painful. -She can be weightbearing as tolerated in a cam boot.  She already has a cam boot that she will bring to the surgery center. -Informed surgical risk consent was reviewed and read aloud  to the patient.  I reviewed the films.  I have discussed my findings with the patient in great detail.  I have discussed all risks including but not limited to infection, stiffness, scarring, limp, disability, deformity, damage to blood vessels and nerves, numbness, poor healing, need for braces, arthritis, chronic pain, amputation, death.  All benefits and realistic expectations discussed in great detail.  I have made no promises as to the outcome.  I have provided realistic expectations.  I have offered the patient a 2nd opinion, which they have declined and assured me they preferred to proceed despite the risks -A total of 33 minutes minutes was spent in direct patient care as well as pre and post patient encounter activities.  This includes documentation as well as reviewing patient chart for labs, imaging, past medical, surgical, social, and family history as documented in the EMR.  I have reviewed medication allergies as documented in EMR.  I discussed the etiology of condition and treatment options from conservative to surgical care.  All risks and benefit of the treatment course was discussed in detail.  All  questions were answered and return appointment was discussed.  Since the visit completed in an ambulatory/outpatient setting, the patient and/or parent/guardian has been advised to contact the providers office for worsening condition and seek medical treatment and/or call 911 if the patient deems either is necessary.   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  Given that she still continues to have about the same pain as progressive gotten worse.  I believe patient will benefit from an MRI evaluation to assess the neuroma in the first interspace as well as a possible plantar fascial tear at the insertion into the first metatarsophalangeal joint. -Patient states understand would like to obtain an MRI.  MRI was ordered -Continue wearing the cam boot for now.   No follow-ups on file.

## 2020-07-27 ENCOUNTER — Telehealth: Payer: Self-pay | Admitting: Urology

## 2020-07-27 NOTE — Telephone Encounter (Signed)
DOS -- 08/22/20  REMOVAL FIXATION DEEP LEFT --- 20680  [employer] EFFECTIVE DATE - 06/22/2018  PLAN DEDUCTIBLE - $500.00  OUT OF POCKET -  $2,500.00  COINSURANCE - 20% COPAY - $0.00   PER [employer]S AUTOMATIVE SYSTEM FOR CPT CODE 33545 NO PRIOR AUTH IS REQUIRED.  REF # [id number]

## 2020-08-22 ENCOUNTER — Encounter: Payer: Self-pay | Admitting: Podiatry

## 2020-08-22 ENCOUNTER — Other Ambulatory Visit: Payer: Self-pay | Admitting: Podiatry

## 2020-08-22 DIAGNOSIS — Z4889 Encounter for other specified surgical aftercare: Secondary | ICD-10-CM | POA: Diagnosis not present

## 2020-08-22 MED ORDER — IBUPROFEN 800 MG PO TABS: 800.0000 mg | ORAL_TABLET | Freq: Four times a day (QID) | ORAL | 1 refills | Status: AC | PRN

## 2020-08-22 MED ORDER — OXYCODONE-ACETAMINOPHEN 5-325 MG PO TABS: 1.0000 | ORAL_TABLET | ORAL | 0 refills | Status: DC | PRN

## 2020-08-23 ENCOUNTER — Encounter: Payer: Self-pay | Admitting: Podiatry

## 2020-08-31 ENCOUNTER — Encounter: Payer: Managed Care, Other (non HMO) | Admitting: Podiatry

## 2020-09-02 ENCOUNTER — Encounter: Payer: Self-pay | Admitting: Podiatry

## 2020-09-02 ENCOUNTER — Ambulatory Visit (INDEPENDENT_AMBULATORY_CARE_PROVIDER_SITE_OTHER): Payer: Managed Care, Other (non HMO)

## 2020-09-02 ENCOUNTER — Other Ambulatory Visit: Payer: Self-pay

## 2020-09-02 ENCOUNTER — Ambulatory Visit (INDEPENDENT_AMBULATORY_CARE_PROVIDER_SITE_OTHER): Payer: Managed Care, Other (non HMO) | Admitting: Podiatry

## 2020-09-02 DIAGNOSIS — M79672 Pain in left foot: Secondary | ICD-10-CM | POA: Diagnosis not present

## 2020-09-02 DIAGNOSIS — Z9889 Other specified postprocedural states: Secondary | ICD-10-CM

## 2020-09-02 DIAGNOSIS — T85848D Pain due to other internal prosthetic devices, implants and grafts, subsequent encounter: Secondary | ICD-10-CM

## 2020-09-02 DIAGNOSIS — F3340 Major depressive disorder, recurrent, in remission, unspecified: Secondary | ICD-10-CM | POA: Insufficient documentation

## 2020-09-06 ENCOUNTER — Encounter: Payer: Self-pay | Admitting: Podiatry

## 2020-09-06 NOTE — Progress Notes (Signed)
Subjective:  Patient ID: Beverly Ford, female    DOB: 22-Jun-1975,  MRN: [id number]  Chief Complaint  Patient presents with  . Routine Post Op    POV #1 DOS 08/22/2020 LT FOOT REMOVAL OF ALL HARDWARE Manageable pain lvls, denies f/c/n/v, minimal swelling, dried blood on wrappings.      45 y.o. female returns for post-op check.  Patient states she is doing well.  She does not have any pain the pain is being more manageable.  She has been ambulating with a cam boot.  No nausea fever chills or vomiting.  She denies any other issues.  Review of Systems: Negative except as noted in the HPI. Denies N/V/F/Ch.  Past Medical History:  Diagnosis Date  . GAD (generalized anxiety disorder)   . History of diverticulitis of colon   . History of DVT of lower extremity 11/2015   per pt post op left bunion LLE dvt and completed blood thinner  . History of kidney stones   . Left ureteral stone   . MDD (major depressive disorder)   . Mild obstructive sleep apnea    per study in epic 11-08-2015,  per pt no cpap recommendation  . Nephrolithiasis    bilateral nonobstructive   . Thoracic outlet syndrome    followed by dr branch @ [facility] -- intermittant symptoms, but no spine related and been referred for second opinion    Current Outpatient Medications:  .  DULoxetine (CYMBALTA) 20 MG capsule, Take 1 capsule by mouth daily., Disp: , Rfl:  .  amoxicillin-clavulanate (AUGMENTIN) 875-125 MG tablet, Take 1 tablet by mouth 2 (two) times daily., Disp: , Rfl:  .  fluconazole (DIFLUCAN) 150 MG tablet, Take 150 mg by mouth once., Disp: , Rfl:  .  gabapentin (NEURONTIN) 300 MG capsule, TAKE ONE CAPSULE BY MOUTH TWICE A DAY (Patient not taking: No sig reported), Disp: , Rfl:  .  hydrochlorothiazide (HYDRODIURIL) 12.5 MG tablet, Take 12.5 mg by mouth daily., Disp: , Rfl:  .  hydrOXYzine (ATARAX/VISTARIL) 25 MG tablet, Take 25 mg by mouth 2 (two) times daily., Disp: , Rfl:  .  ibuprofen (ADVIL) 800 MG  tablet, Take 1 tablet (800 mg total) by mouth every 6 (six) hours as needed., Disp: 60 tablet, Rfl: 1 .  ipratropium (ATROVENT) 0.06 % nasal spray, Place 2 sprays into both nostrils 3 (three) times daily. (Patient taking differently: Place 2 sprays into both nostrils 3 (three) times daily as needed.), Disp: 15 mL, Rfl: 1 .  ketorolac (TORADOL) 10 MG tablet, Take 10 mg by mouth every 6 (six) hours as needed., Disp: , Rfl:  .  levocetirizine (XYZAL) 5 MG tablet, Take 1 tablet (5 mg total) by mouth every evening., Disp: 30 tablet, Rfl: 0 .  methocarbamol (ROBAXIN) 500 MG tablet, Take 500 mg by mouth 3 (three) times daily., Disp: , Rfl:  .  ondansetron (ZOFRAN ODT) 4 MG disintegrating tablet, 4mg  ODT q4 hours prn nausea/vomit, Disp: 20 tablet, Rfl: 0 .  oxyCODONE-acetaminophen (PERCOCET) 5-325 MG tablet, Take 1-2 tablets by mouth every 4 (four) hours as needed for severe pain., Disp: 30 tablet, Rfl: 0 .  phenazopyridine (PYRIDIUM) 200 MG tablet, Take 1 tablet (200 mg total) by mouth 3 (three) times daily as needed for pain., Disp: 10 tablet, Rfl: 0 .  potassium chloride (KLOR-CON) 10 MEQ tablet, Take 10 mEq by mouth 2 (two) times daily., Disp: , Rfl:  .  predniSONE (DELTASONE) 20 MG tablet, Take 2 tablets (40 mg total)  by mouth daily with breakfast., Disp: 10 tablet, Rfl: 0 .  sertraline (ZOLOFT) 100 MG tablet, Take 100 mg by mouth at bedtime., Disp: , Rfl:  .  tamsulosin (FLOMAX) 0.4 MG CAPS capsule, Take 1 capsule (0.4 mg total) by mouth daily after supper. Take until the stent is removed., Disp: , Rfl:  .  traMADol (ULTRAM) 50 MG tablet, Take 1-2 tablets (50-100 mg total) by mouth every 6 (six) hours as needed for moderate pain., Disp: 15 tablet, Rfl: 0 .  traZODone (DESYREL) 50 MG tablet, Take 50 mg by mouth at bedtime., Disp: , Rfl:  .  triamcinolone (KENALOG) 0.025 % ointment, Apply 1 application topically 2 (two) times daily., Disp: 160 g, Rfl: 0  Social History   Tobacco Use  Smoking Status  Never Smoker  Smokeless Tobacco Never Used    Allergies  Allergen Reactions  . Other     Other reaction(s): Other IVP dye  Unknown reaction IVP dye  Unknown reaction   . Betadine [Povidone Iodine] Hives    blisters  . Iodinated Diagnostic Agents     Other reaction(s): Other (See Comments) IVP dye  Unknown reaction  . Shellfish Allergy Hives    All shellfish  . Topiramate     Other reaction(s): Other kidney stones kidney stones    Objective:  There were no vitals filed for this visit. There is no height or weight on file to calculate BMI. Constitutional Well developed. Well nourished.  Vascular Foot warm and well perfused. Capillary refill normal to all digits.   Neurologic Normal speech. Oriented to person, place, and time. Epicritic sensation to light touch grossly present bilaterally.  Dermatologic  stitches were removed.  No dehiscence noted.  No clinical signs of infection noted.  Skin has completely epithelialized.  Orthopedic: Tenderness to palpation noted about the surgical site.   Radiographs: 3 views of skeletally mature adult left foot: No previous hardware noted.  Bone is healing well.  Good good correction alignment held. Assessment:   1. Pain from implanted hardware, subsequent encounter   2. Status post foot surgery    Plan:  Patient was evaluated and treated and all questions answered.  S/p foot surgery left -Progressing as expected post-operatively. -XR: See above -WB Status: Weightbearing as tolerated in cam boot -Sutures: Removed.  No clinical signs of dehiscence noted no complication noted -Medications: None -Foot redressed.  No follow-ups on file.

## 2020-09-14 ENCOUNTER — Encounter: Payer: Managed Care, Other (non HMO) | Admitting: Podiatry

## 2020-09-30 ENCOUNTER — Ambulatory Visit (INDEPENDENT_AMBULATORY_CARE_PROVIDER_SITE_OTHER): Payer: Managed Care, Other (non HMO) | Admitting: Podiatry

## 2020-09-30 ENCOUNTER — Other Ambulatory Visit: Payer: Self-pay

## 2020-09-30 DIAGNOSIS — T85848D Pain due to other internal prosthetic devices, implants and grafts, subsequent encounter: Secondary | ICD-10-CM

## 2020-09-30 DIAGNOSIS — Z9889 Other specified postprocedural states: Secondary | ICD-10-CM

## 2020-10-06 ENCOUNTER — Encounter: Payer: Self-pay | Admitting: Podiatry

## 2020-10-06 NOTE — Progress Notes (Signed)
Subjective:  Patient ID: Beverly Ford, female    DOB: 1975/09/24,  MRN: [id number]  Chief Complaint  Patient presents with   Routine Post Op    POST OP      45 y.o. female returns for post-op check.  Patient states she is doing well.  She does not have similar pain that she had prior to hardware removal.  She is doing well.  She is ambulating in regular shoes.  Review of Systems: Negative except as noted in the HPI. Denies N/V/F/Ch.  Past Medical History:  Diagnosis Date   GAD (generalized anxiety disorder)    History of diverticulitis of colon    History of DVT of lower extremity 11/2015   per pt post op left bunion LLE dvt and completed blood thinner   History of kidney stones    Left ureteral stone    MDD (major depressive disorder)    Mild obstructive sleep apnea    per study in epic 11-08-2015,  per pt no cpap recommendation   Nephrolithiasis    bilateral nonobstructive    Thoracic outlet syndrome    followed by dr branch @ [facility] -- intermittant symptoms, but no spine related and been referred for second opinion    Current Outpatient Medications:    amoxicillin-clavulanate (AUGMENTIN) 875-125 MG tablet, Take 1 tablet by mouth 2 (two) times daily., Disp: , Rfl:    DULoxetine (CYMBALTA) 20 MG capsule, Take 1 capsule by mouth daily., Disp: , Rfl:    fluconazole (DIFLUCAN) 150 MG tablet, Take 150 mg by mouth once., Disp: , Rfl:    gabapentin (NEURONTIN) 300 MG capsule, TAKE ONE CAPSULE BY MOUTH TWICE A DAY (Patient not taking: No sig reported), Disp: , Rfl:    hydrochlorothiazide (HYDRODIURIL) 12.5 MG tablet, Take 12.5 mg by mouth daily., Disp: , Rfl:    hydrOXYzine (ATARAX/VISTARIL) 25 MG tablet, Take 25 mg by mouth 2 (two) times daily., Disp: , Rfl:    ibuprofen (ADVIL) 800 MG tablet, Take 1 tablet (800 mg total) by mouth every 6 (six) hours as needed., Disp: 60 tablet, Rfl: 1   ipratropium (ATROVENT) 0.06 % nasal spray, Place 2 sprays into both nostrils 3 (three)  times daily. (Patient taking differently: Place 2 sprays into both nostrils 3 (three) times daily as needed.), Disp: 15 mL, Rfl: 1   ketorolac (TORADOL) 10 MG tablet, Take 10 mg by mouth every 6 (six) hours as needed., Disp: , Rfl:    levocetirizine (XYZAL) 5 MG tablet, Take 1 tablet (5 mg total) by mouth every evening., Disp: 30 tablet, Rfl: 0   methocarbamol (ROBAXIN) 500 MG tablet, Take 500 mg by mouth 3 (three) times daily., Disp: , Rfl:    ondansetron (ZOFRAN ODT) 4 MG disintegrating tablet, 4mg  ODT q4 hours prn nausea/vomit, Disp: 20 tablet, Rfl: 0   oxyCODONE-acetaminophen (PERCOCET) 5-325 MG tablet, Take 1-2 tablets by mouth every 4 (four) hours as needed for severe pain., Disp: 30 tablet, Rfl: 0   phenazopyridine (PYRIDIUM) 200 MG tablet, Take 1 tablet (200 mg total) by mouth 3 (three) times daily as needed for pain., Disp: 10 tablet, Rfl: 0   potassium chloride (KLOR-CON) 10 MEQ tablet, Take 10 mEq by mouth 2 (two) times daily., Disp: , Rfl:    predniSONE (DELTASONE) 20 MG tablet, Take 2 tablets (40 mg total) by mouth daily with breakfast., Disp: 10 tablet, Rfl: 0   sertraline (ZOLOFT) 100 MG tablet, Take 100 mg by mouth at bedtime., Disp: , Rfl:  tamsulosin (FLOMAX) 0.4 MG CAPS capsule, Take 1 capsule (0.4 mg total) by mouth daily after supper. Take until the stent is removed., Disp: , Rfl:    traMADol (ULTRAM) 50 MG tablet, Take 1-2 tablets (50-100 mg total) by mouth every 6 (six) hours as needed for moderate pain., Disp: 15 tablet, Rfl: 0   traZODone (DESYREL) 50 MG tablet, Take 50 mg by mouth at bedtime., Disp: , Rfl:    triamcinolone (KENALOG) 0.025 % ointment, Apply 1 application topically 2 (two) times daily., Disp: 160 g, Rfl: 0  Social History   Tobacco Use  Smoking Status Never  Smokeless Tobacco Never    Allergies  Allergen Reactions   Other     Other reaction(s): Other IVP dye  Unknown reaction IVP dye  Unknown reaction    Betadine [Povidone Iodine] Hives     blisters   Iodinated Diagnostic Agents     Other reaction(s): Other (See Comments) IVP dye  Unknown reaction   Shellfish Allergy Hives    All shellfish   Topiramate     Other reaction(s): Other kidney stones kidney stones    Objective:  There were no vitals filed for this visit. There is no height or weight on file to calculate BMI. Constitutional Well developed. Well nourished.  Vascular Foot warm and well perfused. Capillary refill normal to all digits.   Neurologic Normal speech. Oriented to person, place, and time. Epicritic sensation to light touch grossly present bilaterally.  Dermatologic  stitches were removed.  No dehiscence noted.  No clinical signs of infection noted.  Skin has completely epithelialized.  Orthopedic: Tenderness to palpation noted about the surgical site.   Radiographs: 3 views of skeletally mature adult left foot: No previous hardware noted.  Bone is healing well.  Good good correction alignment held. Assessment:   1. Pain from implanted hardware, subsequent encounter   2. Status post foot surgery     Plan:  Patient was evaluated and treated and all questions answered.  S/p foot surgery left -Progressing as expected post-operatively. -XR: See above -WB Status: Position in regular shoes. -Sutures: None -Medications: None -If any foot and ankle issues arise have asked her to come back and see me.  Ultimately as her skin is very epithelialized completely and her pain is essentially gone.  She still has some numbness and tingling as expected postoperatively. -I will see her back 1 more time for final clearance.  No follow-ups on file.

## 2020-11-11 ENCOUNTER — Ambulatory Visit (INDEPENDENT_AMBULATORY_CARE_PROVIDER_SITE_OTHER): Payer: Managed Care, Other (non HMO) | Admitting: Podiatry

## 2020-11-11 ENCOUNTER — Other Ambulatory Visit: Payer: Self-pay

## 2020-11-11 ENCOUNTER — Encounter: Payer: Self-pay | Admitting: Podiatry

## 2020-11-11 DIAGNOSIS — Z9889 Other specified postprocedural states: Secondary | ICD-10-CM

## 2020-11-11 DIAGNOSIS — T85848D Pain due to other internal prosthetic devices, implants and grafts, subsequent encounter: Secondary | ICD-10-CM

## 2020-11-11 NOTE — Progress Notes (Signed)
Subjective:  Patient ID: Beverly Ford, female    DOB: 1976/01/05,  MRN: [id number]  Chief Complaint  Patient presents with   Routine Post Op    POST OP DOS 5.2.22     45 y.o. female returns for post-op check.  Patient states she is doing well.  She does not have any pain.  She is ambulating in regular shoes without any acute complaints.  Review of Systems: Negative except as noted in the HPI. Denies N/V/F/Ch.  Past Medical History:  Diagnosis Date   GAD (generalized anxiety disorder)    History of diverticulitis of colon    History of DVT of lower extremity 11/2015   per pt post op left bunion LLE dvt and completed blood thinner   History of kidney stones    Left ureteral stone    MDD (major depressive disorder)    Mild obstructive sleep apnea    per study in epic 11-08-2015,  per pt no cpap recommendation   Nephrolithiasis    bilateral nonobstructive    Thoracic outlet syndrome    followed by dr branch @ [facility] -- intermittant symptoms, but no spine related and been referred for second opinion    Current Outpatient Medications:    amoxicillin-clavulanate (AUGMENTIN) 875-125 MG tablet, Take 1 tablet by mouth 2 (two) times daily., Disp: , Rfl:    DULoxetine (CYMBALTA) 20 MG capsule, Take 1 capsule by mouth daily., Disp: , Rfl:    fluconazole (DIFLUCAN) 150 MG tablet, Take 150 mg by mouth once., Disp: , Rfl:    gabapentin (NEURONTIN) 300 MG capsule, TAKE ONE CAPSULE BY MOUTH TWICE A DAY (Patient not taking: No sig reported), Disp: , Rfl:    hydrochlorothiazide (HYDRODIURIL) 12.5 MG tablet, Take 12.5 mg by mouth daily., Disp: , Rfl:    hydrOXYzine (ATARAX/VISTARIL) 25 MG tablet, Take 25 mg by mouth 2 (two) times daily., Disp: , Rfl:    ibuprofen (ADVIL) 800 MG tablet, Take 1 tablet (800 mg total) by mouth every 6 (six) hours as needed., Disp: 60 tablet, Rfl: 1   ipratropium (ATROVENT) 0.06 % nasal spray, Place 2 sprays into both nostrils 3 (three) times daily. (Patient  taking differently: Place 2 sprays into both nostrils 3 (three) times daily as needed.), Disp: 15 mL, Rfl: 1   ketorolac (TORADOL) 10 MG tablet, Take 10 mg by mouth every 6 (six) hours as needed., Disp: , Rfl:    levocetirizine (XYZAL) 5 MG tablet, Take 1 tablet (5 mg total) by mouth every evening., Disp: 30 tablet, Rfl: 0   methocarbamol (ROBAXIN) 500 MG tablet, Take 500 mg by mouth 3 (three) times daily., Disp: , Rfl:    ondansetron (ZOFRAN ODT) 4 MG disintegrating tablet, 4mg  ODT q4 hours prn nausea/vomit, Disp: 20 tablet, Rfl: 0   oxyCODONE-acetaminophen (PERCOCET) 5-325 MG tablet, Take 1-2 tablets by mouth every 4 (four) hours as needed for severe pain., Disp: 30 tablet, Rfl: 0   phenazopyridine (PYRIDIUM) 200 MG tablet, Take 1 tablet (200 mg total) by mouth 3 (three) times daily as needed for pain., Disp: 10 tablet, Rfl: 0   potassium chloride (KLOR-CON) 10 MEQ tablet, Take 10 mEq by mouth 2 (two) times daily., Disp: , Rfl:    predniSONE (DELTASONE) 20 MG tablet, Take 2 tablets (40 mg total) by mouth daily with breakfast., Disp: 10 tablet, Rfl: 0   sertraline (ZOLOFT) 100 MG tablet, Take 100 mg by mouth at bedtime., Disp: , Rfl:    tamsulosin (FLOMAX) 0.4 MG CAPS  capsule, Take 1 capsule (0.4 mg total) by mouth daily after supper. Take until the stent is removed., Disp: , Rfl:    traMADol (ULTRAM) 50 MG tablet, Take 1-2 tablets (50-100 mg total) by mouth every 6 (six) hours as needed for moderate pain., Disp: 15 tablet, Rfl: 0   traZODone (DESYREL) 50 MG tablet, Take 50 mg by mouth at bedtime., Disp: , Rfl:    triamcinolone (KENALOG) 0.025 % ointment, Apply 1 application topically 2 (two) times daily., Disp: 160 g, Rfl: 0  Social History   Tobacco Use  Smoking Status Never  Smokeless Tobacco Never    Allergies  Allergen Reactions   Other     Other reaction(s): Other IVP dye  Unknown reaction IVP dye  Unknown reaction    Betadine [Povidone Iodine] Hives    blisters   Iodinated  Diagnostic Agents     Other reaction(s): Other (See Comments) IVP dye  Unknown reaction   Shellfish Allergy Hives    All shellfish   Topiramate     Other reaction(s): Other kidney stones kidney stones    Objective:  There were no vitals filed for this visit. There is no height or weight on file to calculate BMI. Constitutional Well developed. Well nourished.  Vascular Foot warm and well perfused. Capillary refill normal to all digits.   Neurologic Normal speech. Oriented to person, place, and time. Epicritic sensation to light touch grossly present bilaterally.  Dermatologic  stitches were removed.  No dehiscence noted.  No clinical signs of infection noted.  Skin has completely epithelialized.  Orthopedic: Tenderness to palpation noted about the surgical site.   Radiographs: 3 views of skeletally mature adult left foot: No previous hardware noted.  Bone is healing well.  Good good correction alignment held. Assessment:   1. Pain from implanted hardware, subsequent encounter   2. Status post foot surgery      Plan:  Patient was evaluated and treated and all questions answered.  S/p foot surgery left -Progressing as expected post-operatively. -XR: See above -WB Status: Position in regular shoes. -Sutures: None -Medications: None -At this time clinically her pain has completely resolved.  If any foot and ankle issues arise in future of asked her to come back and see me.  She states understanding. No follow-ups on file.

## 2020-11-17 ENCOUNTER — Encounter (HOSPITAL_BASED_OUTPATIENT_CLINIC_OR_DEPARTMENT_OTHER): Payer: Self-pay

## 2020-11-17 ENCOUNTER — Emergency Department (HOSPITAL_BASED_OUTPATIENT_CLINIC_OR_DEPARTMENT_OTHER)
Admission: EM | Admit: 2020-11-17 | Discharge: 2020-11-17 | Disposition: A | Discharge status: Home / Self Care | Payer: Managed Care, Other (non HMO) | Attending: Emergency Medicine | Admitting: Emergency Medicine

## 2020-11-17 ENCOUNTER — Other Ambulatory Visit: Payer: Self-pay

## 2020-11-17 ENCOUNTER — Emergency Department (HOSPITAL_BASED_OUTPATIENT_CLINIC_OR_DEPARTMENT_OTHER): Payer: Managed Care, Other (non HMO)

## 2020-11-17 ENCOUNTER — Other Ambulatory Visit (HOSPITAL_BASED_OUTPATIENT_CLINIC_OR_DEPARTMENT_OTHER): Payer: Self-pay

## 2020-11-17 DIAGNOSIS — N12 Tubulo-interstitial nephritis, not specified as acute or chronic: Secondary | ICD-10-CM | POA: Diagnosis not present

## 2020-11-17 DIAGNOSIS — R109 Unspecified abdominal pain: Secondary | ICD-10-CM | POA: Diagnosis present

## 2020-11-17 LAB — URINALYSIS, ROUTINE W REFLEX MICROSCOPIC
Bilirubin Urine: NEGATIVE
Glucose, UA: NEGATIVE mg/dL
Hgb urine dipstick: NEGATIVE
Ketones, ur: NEGATIVE mg/dL
Nitrite: NEGATIVE
Protein, ur: NEGATIVE mg/dL
Specific Gravity, Urine: 1.025 (ref 1.005–1.030)
pH: 5.5 (ref 5.0–8.0)

## 2020-11-17 LAB — COMPREHENSIVE METABOLIC PANEL
ALT: 19 U/L (ref 0–44)
AST: 22 U/L (ref 15–41)
Albumin: 3.7 g/dL (ref 3.5–5.0)
Alkaline Phosphatase: 82 U/L (ref 38–126)
Anion gap: 5 (ref 5–15)
BUN: 12 mg/dL (ref 6–20)
CO2: 25 mmol/L (ref 22–32)
Calcium: 8.6 mg/dL — ABNORMAL LOW (ref 8.9–10.3)
Chloride: 103 mmol/L (ref 98–111)
Creatinine, Ser: 1.01 mg/dL — ABNORMAL HIGH (ref 0.44–1.00)
GFR, Estimated: >60 mL/min (ref >60)
Glucose, Bld: 92 mg/dL (ref 70–99)
Potassium: 3.7 mmol/L (ref 3.5–5.1)
Sodium: 133 mmol/L — ABNORMAL LOW (ref 135–145)
Total Bilirubin: 0.4 mg/dL (ref 0.3–1.2)
Total Protein: 6.8 g/dL (ref 6.5–8.1)

## 2020-11-17 LAB — URINALYSIS, MICROSCOPIC (REFLEX)

## 2020-11-17 LAB — CBC WITH DIFFERENTIAL/PLATELET
Abs Immature Granulocytes: 0.03 10*3/uL (ref 0.00–0.07)
Basophils Absolute: 0 10*3/uL (ref 0.0–0.1)
Basophils Relative: 0 %
Eosinophils Absolute: 0.2 10*3/uL (ref 0.0–0.5)
Eosinophils Relative: 2 %
HCT: 39.9 % (ref 36.0–46.0)
Hemoglobin: 13.2 g/dL (ref 12.0–15.0)
Immature Granulocytes: 0 %
Lymphocytes Relative: 21 %
Lymphs Abs: 1.7 10*3/uL (ref 0.7–4.0)
MCH: 27.7 pg (ref 26.0–34.0)
MCHC: 33.1 g/dL (ref 30.0–36.0)
MCV: 83.6 fL (ref 80.0–100.0)
Monocytes Absolute: 0.8 10*3/uL (ref 0.1–1.0)
Monocytes Relative: 10 %
Neutro Abs: 5.3 10*3/uL (ref 1.7–7.7)
Neutrophils Relative %: 67 %
Platelets: 274 10*3/uL (ref 150–400)
RBC: 4.77 MIL/uL (ref 3.87–5.11)
RDW: 14.3 % (ref 11.5–15.5)
WBC: 8 10*3/uL (ref 4.0–10.5)
nRBC: 0 % (ref 0.0–0.2)

## 2020-11-17 LAB — PREGNANCY, URINE: Preg Test, Ur: NEGATIVE

## 2020-11-17 MED ORDER — KETOROLAC TROMETHAMINE 15 MG/ML IJ SOLN
15.0000 mg | Freq: Once | INTRAMUSCULAR | Status: AC
  Administered 2020-11-17: 15 mg via INTRAVENOUS
  Filled 2020-11-17: qty 1

## 2020-11-17 MED ORDER — SODIUM CHLORIDE 0.9 % IV SOLN
1.0000 g | Freq: Once | INTRAVENOUS | Status: AC
  Administered 2020-11-17: 1 g via INTRAVENOUS
  Filled 2020-11-17: qty 10

## 2020-11-17 MED ORDER — SODIUM CHLORIDE 0.9 % IV BOLUS
1000.0000 mL | Freq: Once | INTRAVENOUS | Status: AC
  Administered 2020-11-17: 1000 mL via INTRAVENOUS

## 2020-11-17 MED ORDER — CEPHALEXIN 500 MG PO CAPS
500.0000 mg | ORAL_CAPSULE | Freq: Three times a day (TID) | ORAL | 0 refills | Status: AC
  Filled 2020-11-17: qty 20, 7d supply, fill #0

## 2020-11-17 NOTE — ED Provider Notes (Signed)
[facility] EMERGENCY DEPARTMENT Provider Note   CSN: [id number] Arrival date & time: 11/17/20  1213     History Chief Complaint  Patient presents with   Flank Pain    Beverly Ford is a 45 y.o. female.  Patient presents ER chief complaint of left-sided flank pain described as sharp and persistent.  Ongoing for 4 days.  Denies fevers or cough or vomiting or diarrhea.  Nothing seems to make it better or worse.  No pain with urination but she did notice some pinkish urination earlier today.      Past Medical History:  Diagnosis Date   GAD (generalized anxiety disorder)    History of diverticulitis of colon    History of DVT of lower extremity 11/2015   per pt post op left bunion LLE dvt and completed blood thinner   History of kidney stones    Left ureteral stone    MDD (major depressive disorder)    Mild obstructive sleep apnea    per study in epic 11-08-2015,  per pt no cpap recommendation   Nephrolithiasis    bilateral nonobstructive    Thoracic outlet syndrome    followed by dr branch @ [facility] -- intermittant symptoms, but no spine related and been referred for second opinion    Patient Active Problem List   Diagnosis Date Noted   Recurrent major depression in remission (HCC) 09/02/2020   Cervical radicular pain 09/28/2019   Thoracic spine pain 09/28/2019   Moderate episode of recurrent major depressive disorder (HCC) 10/15/2018   Generalized anxiety disorder 10/15/2018   S/P lumbar fusion 12/04/2017   Chronic back pain greater than 3 months duration 11/16/2017   Migraine with aura and without status migrainosus, not intractable 07/04/2017   Calculus of kidney 04/04/2017   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 12/08/2015   OSA (obstructive sleep apnea) 10/19/2015   Chest pain 12/24/2007   Heart murmur 12/24/2007   Palpitations 12/24/2007    Past Surgical History:  Procedure Laterality Date   BUNIONECTOMY Left  12/08/2015   CYSTOSCOPY/URETEROSCOPY/HOLMIUM LASER/STENT PLACEMENT Left 04/07/2020   Procedure: CYSTOSCOPY LEFT RETROGRADE PYELOGRAM URETEROSCOPY/HOLMIUM LASER/STENT PLACEMENT;  Surgeon: 04/09/2020, MD;  Location: [facility];  Service: Urology;  Laterality: Left;   LAPAROSCOPIC CHOLECYSTECTOMY  07/2015   LUMBAR DISC SURGERY  10/05/2016   L5-- S1   LUMBAR FUSION  01/24/2018   L5-- S1   NASAL SEPTUM SURGERY  2006   TUBAL LIGATION Bilateral 2004   URETEROSCOPY WITH HOLMIUM LASER LITHOTRIPSY  2013     OB History   No obstetric history on file.     Family History  Problem Relation Age of Onset   Diabetes Mother    Cancer Father    Kidney disease Father    Hypertension Father     Social History   Tobacco Use   Smoking status: Never   Smokeless tobacco: Never  Vaping Use   Vaping Use: Never used  Substance Use Topics   Alcohol use: Yes    Comment: social   Drug use: Never    Home Medications Prior to Admission medications   Medication Sig Start Date End Date Taking? Authorizing Provider  cephALEXin (KEFLEX) 500 MG capsule Take 1 capsule (500 mg total) by mouth 3 (three) times daily for 7 days. 11/17/20 11/24/20 Yes 01/24/21, MD  amoxicillin-clavulanate (AUGMENTIN) 875-125 MG tablet Take 1 tablet by mouth 2 (two) times daily. 07/06/20   [provider]  DULoxetine (CYMBALTA) 20 MG capsule Take 1 capsule by mouth daily. 08/08/20   [provider]  fluconazole (DIFLUCAN) 150 MG tablet Take 150 mg by mouth once. 07/06/20   [provider]  gabapentin (NEURONTIN) 300 MG capsule TAKE ONE CAPSULE BY MOUTH TWICE A DAY Patient not taking: No sig reported 10/07/18   [provider]  hydrochlorothiazide (HYDRODIURIL) 12.5 MG tablet Take 12.5 mg by mouth daily. 05/21/18 04/07/20  [provider]  hydrOXYzine (ATARAX/VISTARIL) 25 MG tablet Take 25 mg by mouth 2 (two) times daily. 08/16/20   [provider]   ibuprofen (ADVIL) 800 MG tablet Take 1 tablet (800 mg total) by mouth every 6 (six) hours as needed. 08/22/20   Candelaria Stagers, DPM  ipratropium (ATROVENT) 0.06 % nasal spray Place 2 sprays into both nostrils 3 (three) times daily. Patient taking differently: Place 2 sprays into both nostrils 3 (three) times daily as needed. 04/27/19   Bing Neighbors, FNP  ketorolac (TORADOL) 10 MG tablet Take 10 mg by mouth every 6 (six) hours as needed. 03/22/20   [provider]  levocetirizine (XYZAL) 5 MG tablet Take 1 tablet (5 mg total) by mouth every evening. 04/27/19   Bing Neighbors, FNP  methocarbamol (ROBAXIN) 500 MG tablet Take 500 mg by mouth 3 (three) times daily. 01/01/20   [provider]  ondansetron (ZOFRAN ODT) 4 MG disintegrating tablet 4mg  ODT q4 hours prn nausea/vomit 03/03/20   13/11/21, DO  oxyCODONE-acetaminophen (PERCOCET) 5-325 MG tablet Take 1-2 tablets by mouth every 4 (four) hours as needed for severe pain. 08/22/20   10/22/20, DPM  phenazopyridine (PYRIDIUM) 200 MG tablet Take 1 tablet (200 mg total) by mouth 3 (three) times daily as needed for pain. 04/07/20   04/09/20, MD  potassium chloride (KLOR-CON) 10 MEQ tablet Take 10 mEq by mouth 2 (two) times daily.    [provider]  predniSONE (DELTASONE) 20 MG tablet Take 2 tablets (40 mg total) by mouth daily with breakfast. 06/15/20   06/17/20, FNP  sertraline (ZOLOFT) 100 MG tablet Take 100 mg by mouth at bedtime.    [provider]  tamsulosin (FLOMAX) 0.4 MG CAPS capsule Take 1 capsule (0.4 mg total) by mouth daily after supper. Take until the stent is removed. 04/07/20   04/09/20, MD  traMADol (ULTRAM) 50 MG tablet Take 1-2 tablets (50-100 mg total) by mouth every 6 (six) hours as needed for moderate pain. 04/07/20   04/09/20, MD  traZODone (DESYREL) 50 MG tablet Take 50 mg by mouth at bedtime. 04/09/19 04/07/20  [provider]   triamcinolone (KENALOG) 0.025 % ointment Apply 1 application topically 2 (two) times daily. 06/15/20   06/17/20, FNP    Allergies    Other, Betadine [povidone iodine], Iodinated diagnostic agents, Shellfish allergy, and Topiramate  Review of Systems   Review of Systems  Constitutional:  Negative for fever.  HENT:  Negative for ear pain.   Eyes:  Negative for pain.  Respiratory:  Negative for cough.   Cardiovascular:  Negative for chest pain.  Gastrointestinal:  Negative for abdominal pain.  Genitourinary:  Positive for flank pain.  Musculoskeletal:  Negative for back pain.  Skin:  Negative for rash.  Neurological:  Negative for headaches.   Physical Exam Updated Vital Signs BP 110/65   Pulse 73   Temp 98.5 F (36.9 C) (Oral)   Resp 16  Ht 5\' 3"  (1.6 m)   Wt 79.4 kg   LMP 10/24/2020   SpO2 98%   BMI 31.00 kg/m   Physical Exam Constitutional:      General: She is not in acute distress.    Appearance: Normal appearance.  HENT:     Head: Normocephalic.     Nose: Nose normal.  Eyes:     Extraocular Movements: Extraocular movements intact.  Cardiovascular:     Rate and Rhythm: Normal rate.  Pulmonary:     Effort: Pulmonary effort is normal.  Abdominal:     Tenderness: There is left CVA tenderness.  Musculoskeletal:        General: Normal range of motion.     Cervical back: Normal range of motion.  Neurological:     General: No focal deficit present.     Mental Status: She is alert. Mental status is at baseline.    ED Results / Procedures / Treatments   Labs (all labs ordered are listed, but only abnormal results are displayed) Labs Reviewed  URINALYSIS, ROUTINE W REFLEX MICROSCOPIC - Abnormal; Notable for the following components:      Result Value   APPearance CLOUDY (*)    Leukocytes,Ua MODERATE (*)    All other components within normal limits  URINALYSIS, MICROSCOPIC (REFLEX) - Abnormal; Notable for the following components:   Bacteria, UA  MANY (*)    All other components within normal limits  COMPREHENSIVE METABOLIC PANEL - Abnormal; Notable for the following components:   Sodium 133 (*)    Creatinine, Ser 1.01 (*)    Calcium 8.6 (*)    All other components within normal limits  PREGNANCY, URINE  CBC WITH DIFFERENTIAL/PLATELET    EKG None  Radiology CT Renal Stone Study  Result Date: 11/17/2020 CLINICAL DATA:  Left flank pain EXAM: CT ABDOMEN AND PELVIS WITHOUT CONTRAST TECHNIQUE: Multidetector CT imaging of the abdomen and pelvis was performed following the standard protocol without IV contrast. COMPARISON:  CT abdomen and pelvis dated March 03, 2020 FINDINGS: Lower chest: No acute abnormality. Hepatobiliary: Unchanged small low-density lesion of the right lobe of the liver located on series 2 image 24, too small to completely characterize. Status post cholecystectomy. No biliary dilatation. Pancreas: Pancreas is unremarkable on noncontrast exam. Spleen: Is normal in contour. Adrenals/Urinary Tract: Normal bilateral adrenal glands.No hydronephrosis. Punctate bilateral nonobstructing renal stones. Two stones which were previously seen in the mid region of the left kidney is no longer present, otherwise similar stone burden compared to prior exam. No stone seen in the ureters or bladder. Stomach/Bowel: Bowel is normal caliber no evidence of wall thickening or obstruction. Normal appendix. Vascular/Lymphatic: No significant vascular findings are present. No enlarged abdominal or pelvic lymph nodes. Reproductive: Uterus is unremarkable. Left adnexal cyst, measuring up to 2.6 cm likely physiologic. Other: No free fluid or free air seen in the abdomen or pelvis. Musculoskeletal: Posterior fusion of L5-S no acute osseous findings. IMPRESSION: No evidence of obstructive uropathy. Punctate bilateral nonobstructing renal stones. Electronically Signed   By: Allegra LaiLeah  Strickland MD   On: 11/17/2020 14:15    Procedures Procedures    Medications Ordered in ED Medications  cefTRIAXone (ROCEPHIN) 1 g in sodium chloride 0.9 % 100 mL IVPB (has no administration in time range)  ketorolac (TORADOL) 15 MG/ML injection 15 mg (15 mg Intravenous Given 11/17/20 1325)  sodium chloride 0.9 % bolus 1,000 mL (1,000 mLs Intravenous New Bag/Given 11/17/20 1325)    ED Course  I have reviewed  the triage vital signs and the nursing notes.  Pertinent labs & imaging results that were available during my care of the patient were reviewed by me and considered in my medical decision making (see chart for details).    MDM Rules/Calculators/A&P                           Labs are unremarkable white count chemistry normal.  Urinalysis positive for urinary tract infection.  CT abdomen pelvis shows no evidence of obstructing kidney stone.  Patient given a gram of Rocephin here, prescription of Keflex to go home with.  Advise follow-up with her doctor within the week.  Advising immediate return for worsening symptoms worsening pain persistent fevers or any additional concerns.  Final Clinical Impression(s) / ED Diagnoses Final diagnoses:  Pyelonephritis    Rx / DC Orders ED Discharge Orders          Ordered    cephALEXin (KEFLEX) 500 MG capsule  3 times daily        11/17/20 1428             Cheryll Cockayne, MD 11/17/20 1428

## 2020-11-17 NOTE — ED Triage Notes (Signed)
Pt c/o left flank/abd pain x 4 days-states feels like kidney stone pain-NAD-steady gait

## 2020-11-17 NOTE — Discharge Instructions (Addendum)
Call your primary care doctor or specialist as discussed in the next 2-3 days.   Return immediately back to the ER if:  Your symptoms worsen within the next 12-24 hours. You develop new symptoms such as new fevers, persistent vomiting, new pain, shortness of breath, or new weakness or numbness, or if you have any other concerns.  

## 2020-11-29 ENCOUNTER — Encounter (HOSPITAL_BASED_OUTPATIENT_CLINIC_OR_DEPARTMENT_OTHER): Payer: Self-pay | Admitting: Emergency Medicine

## 2020-11-29 ENCOUNTER — Other Ambulatory Visit: Payer: Self-pay

## 2020-11-29 ENCOUNTER — Emergency Department (HOSPITAL_BASED_OUTPATIENT_CLINIC_OR_DEPARTMENT_OTHER)
Admission: EM | Admit: 2020-11-29 | Discharge: 2020-11-29 | Disposition: A | Discharge status: Home / Self Care | Payer: Managed Care, Other (non HMO) | Attending: Emergency Medicine | Admitting: Emergency Medicine

## 2020-11-29 DIAGNOSIS — G540 Brachial plexus disorders: Secondary | ICD-10-CM | POA: Diagnosis not present

## 2020-11-29 DIAGNOSIS — Z5321 Procedure and treatment not carried out due to patient leaving prior to being seen by health care provider: Secondary | ICD-10-CM | POA: Diagnosis not present

## 2020-11-29 DIAGNOSIS — R2231 Localized swelling, mass and lump, right upper limb: Secondary | ICD-10-CM | POA: Insufficient documentation

## 2020-11-29 NOTE — ED Triage Notes (Signed)
Pt reports right arm swelling. Pt reports thoracic outlet syndrome. Pt reports she was sent PCP for concern of DVT in subclavian vein.

## 2021-03-28 ENCOUNTER — Other Ambulatory Visit: Payer: Self-pay

## 2021-03-28 ENCOUNTER — Ambulatory Visit
Admission: EM | Admit: 2021-03-28 | Discharge: 2021-03-28 | Disposition: A | Discharge status: Home / Self Care | Payer: Managed Care, Other (non HMO) | Attending: Emergency Medicine | Admitting: Emergency Medicine

## 2021-03-28 DIAGNOSIS — J189 Pneumonia, unspecified organism: Secondary | ICD-10-CM

## 2021-03-28 MED ORDER — AMOXICILLIN-POT CLAVULANATE 875-125 MG PO TABS: 1.0000 | ORAL_TABLET | Freq: Two times a day (BID) | ORAL | 0 refills | Status: AC

## 2021-03-28 NOTE — Discharge Instructions (Addendum)
Begin Augmentin 1 tablet twice daily for 7 days.  Please follow up with your PCP for unresolved symptoms.   Please remain home from work, school, public places until you have been fever free for 24 hours without the use of antifever medications such as Tylenol or ibuprofen.  Conservative care is recommended at this time.  This includes rest, pushing clear fluids and activity as tolerated.  You may also noticed that your appetite is reduced, this is okay as long as they are drinking plenty of clear fluids.  Acetaminophen (Tylenol): This is a good fever reducer.  If there body temperature rises above 101.5 as measured with a thermometer, it is recommended that you give them 1,000 mg every 6-8 hours until they are temperature falls below 101.5, please not take more than 3,000 mg of acetaminophen either as a separate medication or as in ingredient in an over-the-counter cold/flu preparation within a 24-hour period  Ibuprofen  (Advil, Motrin): This is a good anti-inflammatory medication which addresses aches and pains and, to some degree, congestion in the nasal passages.  I recommend giving between 400 to 600 mg every 6-8 hours as needed.  Pseudoephedrine (Sudafed): This is a decongestant.  This medication has to be purchased from the pharmacist counter, I recommend giving 2 tablets, 60 mg, 2-3 times a day as needed to relieve runny nose and sinus drainage.  Guaifenesin (Robitussin, Mucinex): This is an expectorant.  This helps break up chest congestion and loosen up thick nasal drainage making phlegm and drainage more liquid and therefore easier to remove.  I recommend being 400 mg three times daily as needed.  Dextromethorphan (any cough medicine with the letters "DM" added to it's name such as Robitussin DM): This is a cough suppressant.  This is often recommended to be taken at nighttime to suppress cough and help children sleep.  Give dosage as directed on the bottle.   Chloraseptic Throat Spray:  Spray 5 sprays into affected area every 2 hours, hold for 15 seconds and either swallow or spit it out.  This is a excellent numbing medication because it is a spray, you can put it right where you needed and so sucking on a lozenge and numbing your entire mouth.  Please follow-up within the next 3 to 5 days either with your primary care provider or urgent care if your symptoms do not resolve.  If you do not have a primary care provider, we will assist you in finding one.

## 2021-03-28 NOTE — ED Triage Notes (Signed)
Pt states on Sunday she began having fatigue, cough, headache, and congestion (her son tested positive for flu and bacterial pneumonia.

## 2021-03-28 NOTE — ED Provider Notes (Signed)
[facility]    CSN: [id number] Arrival date & time: 03/28/21  0800    HISTORY  No chief complaint on file.  HPI Beverly Ford is a 45 y.o. female. Patient states her son tested positive for flu over the weekend and ultimately ended up in the emergency room, diagnosed with bacterial pneumonia.  Patient states she began to feel dizzy and weak 2 days ago, yesterday began to have a very bad cough.  Patient denies fever, nausea, vomiting, diarrhea, headache.  Endorses body ache, chills, cough productive of sputum.  The history is provided by the patient.  Past Medical History:  Diagnosis Date   GAD (generalized anxiety disorder)    History of diverticulitis of colon    History of DVT of lower extremity 11/2015   per pt post op left bunion LLE dvt and completed blood thinner   History of kidney stones    Left ureteral stone    MDD (major depressive disorder)    Mild obstructive sleep apnea    per study in epic 11-08-2015,  per pt no cpap recommendation   Nephrolithiasis    bilateral nonobstructive    Thoracic outlet syndrome    followed by dr branch @ [facility] -- intermittant symptoms, but no spine related and been referred for second opinion   Patient Active Problem List   Diagnosis Date Noted   Recurrent major depression in remission ([place]) 09/02/2020   Cervical radicular pain 09/28/2019   Thoracic spine pain 09/28/2019   Moderate episode of recurrent major depressive disorder ([place]) 10/15/2018   Generalized anxiety disorder 10/15/2018   S/P lumbar fusion 12/04/2017   Chronic back pain greater than 3 months duration 11/16/2017   Migraine with aura and without status migrainosus, not intractable 07/04/2017   Calculus of kidney 04/04/2017   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 12/08/2015   OSA (obstructive sleep apnea) 10/19/2015   Chest pain 12/24/2007   Heart murmur 12/24/2007   Palpitations 12/24/2007   Past Surgical History:   Procedure Laterality Date   BUNIONECTOMY Left 12/08/2015   CYSTOSCOPY/URETEROSCOPY/HOLMIUM LASER/STENT PLACEMENT Left 04/07/2020   Procedure: CYSTOSCOPY LEFT RETROGRADE PYELOGRAM URETEROSCOPY/HOLMIUM LASER/STENT PLACEMENT;  Surgeon: Ardis Hughs, MD;  Location: [facility];  Service: Urology;  Laterality: Left;   LAPAROSCOPIC CHOLECYSTECTOMY  07/2015   LUMBAR [place] SURGERY  10/05/2016   L5-- S1   LUMBAR FUSION  01/24/2018   L5-- S1   NASAL SEPTUM SURGERY  2006   TUBAL LIGATION Bilateral 2004   URETEROSCOPY WITH HOLMIUM LASER LITHOTRIPSY  2013   OB History   No obstetric history on file.    Home Medications    Prior to Admission medications   Medication Sig Start Date End Date Taking? Authorizing Provider  amoxicillin-clavulanate (AUGMENTIN) 875-125 MG tablet Take 1 tablet by mouth every 12 (twelve) hours for 7 days. 03/28/21 04/04/21 Yes Lynden Oxford Scales, PA-C  DULoxetine (CYMBALTA) 20 MG capsule Take 1 capsule by mouth daily. 08/08/20   [provider]  hydrOXYzine (ATARAX/VISTARIL) 25 MG tablet Take 25 mg by mouth 2 (two) times daily. 08/16/20   [provider]  ibuprofen (ADVIL) 800 MG tablet Take 1 tablet (800 mg total) by mouth every 6 (six) hours as needed. 08/22/20   Felipa Furnace, DPM  ipratropium (ATROVENT) 0.06 % nasal spray Place 2 sprays into both nostrils 3 (three) times daily. Patient taking differently: Place 2 sprays into both nostrils 3 (three) times daily as needed. 04/27/19  Scot Jun, FNP  ketorolac (TORADOL) 10 MG tablet Take 10 mg by mouth every 6 (six) hours as needed. 03/22/20   [provider]  levocetirizine (XYZAL) 5 MG tablet Take 1 tablet (5 mg total) by mouth every evening. 04/27/19   Scot Jun, FNP  methocarbamol (ROBAXIN) 500 MG tablet Take 500 mg by mouth 3 (three) times daily. 01/01/20   [provider]  ondansetron (ZOFRAN ODT) 4 MG disintegrating tablet 4mg  ODT q4 hours prn  nausea/vomit 03/03/20   Deno Etienne, DO  phenazopyridine (PYRIDIUM) 200 MG tablet Take 1 tablet (200 mg total) by mouth 3 (three) times daily as needed for pain. 04/07/20   Ardis Hughs, MD  potassium chloride (KLOR-CON) 10 MEQ tablet Take 10 mEq by mouth 2 (two) times daily.    [provider]  predniSONE (DELTASONE) 20 MG tablet Take 2 tablets (40 mg total) by mouth daily with breakfast. 06/15/20   Scot Jun, FNP  tamsulosin (FLOMAX) 0.4 MG CAPS capsule Take 1 capsule (0.4 mg total) by mouth daily after supper. Take until the stent is removed. 04/07/20   Ardis Hughs, MD  traMADol (ULTRAM) 50 MG tablet Take 1-2 tablets (50-100 mg total) by mouth every 6 (six) hours as needed for moderate pain. 04/07/20   Ardis Hughs, MD  traZODone (DESYREL) 50 MG tablet Take 50 mg by mouth at bedtime. 04/09/19 04/07/20  [provider]  triamcinolone (KENALOG) 0.025 % ointment Apply 1 application topically 2 (two) times daily. 06/15/20   Scot Jun, FNP   Family History Family History  Problem Relation Age of Onset   Diabetes Mother    Cancer Father    Kidney disease Father    Hypertension Father    Social History Social History   Tobacco Use   Smoking status: Never   Smokeless tobacco: Never  Vaping Use   Vaping Use: Never used  Substance Use Topics   Alcohol use: Yes    Comment: social   Drug use: Never   Allergies   Other, Betadine [povidone iodine], Iodinated diagnostic agents, Shellfish allergy, and Topiramate  Review of Systems Review of Systems Pertinent findings noted in history of present illness.   Physical Exam Triage Vital Signs ED Triage Vitals  Enc Vitals Group     BP 02/17/21 0827 (!) 147/82     Pulse Rate 02/17/21 0827 72     Resp 02/17/21 0827 18     Temp 02/17/21 0827 98.3 F (36.8 C)     Temp Source 02/17/21 0827 Oral     SpO2 02/17/21 0827 98 %     Weight --      Height --      Head Circumference --       Peak Flow --      Pain Score 02/17/21 0826 5     Pain Loc --      Pain Edu? --      Excl. in [place]? --   No data found.  Updated Vital Signs BP 122/79 (BP Location: Left Arm)   Pulse 78   Temp 98.7 F (37.1 C) (Oral)   Resp 16   LMP 03/02/2021 (Approximate)   SpO2 97%   Physical Exam Constitutional:      Appearance: She is ill-appearing.  HENT:     Head: Normocephalic and atraumatic.     Salivary Glands: Right salivary gland is not diffusely enlarged or tender. Left salivary gland is not diffusely enlarged or  tender.     Right Ear: Tympanic membrane, ear canal and external ear normal.     Left Ear: Tympanic membrane, ear canal and external ear normal.     Nose: Congestion and rhinorrhea present. Rhinorrhea is clear.     Right Sinus: No maxillary sinus tenderness or frontal sinus tenderness.     Left Sinus: No maxillary sinus tenderness.     Mouth/Throat:     Mouth: Mucous membranes are moist.     Pharynx: Pharyngeal swelling, posterior oropharyngeal erythema and uvula swelling present.     Tonsils: No tonsillar exudate. 0 on the right. 0 on the left.  Cardiovascular:     Rate and Rhythm: Normal rate and regular rhythm.     Pulses: Normal pulses.  Pulmonary:     Effort: Pulmonary effort is normal. No accessory muscle usage, prolonged expiration or respiratory distress.     Breath sounds: No stridor. Examination of the right-upper field reveals rales. Examination of the left-upper field reveals rales. Examination of the right-middle field reveals rales. Examination of the left-middle field reveals rales. Rales present. No wheezing or rhonchi.     Comments: Turbulent breath sounds throughout without wheeze, rale, rhonchi. Abdominal:     General: Abdomen is flat. Bowel sounds are normal.     Palpations: Abdomen is soft.  Musculoskeletal:        General: Normal range of motion.  Lymphadenopathy:     Cervical: Cervical adenopathy present.     Right cervical: Superficial cervical  adenopathy and posterior cervical adenopathy present.     Left cervical: Superficial cervical adenopathy and posterior cervical adenopathy present.  Skin:    General: Skin is warm and dry.  Neurological:     General: No focal deficit present.     Mental Status: She is alert and oriented to person, place, and time.     Motor: Motor function is intact.     Coordination: Coordination is intact.     Gait: Gait is intact.     Deep Tendon Reflexes: Reflexes are normal and symmetric.  Psychiatric:        Attention and Perception: Attention and perception normal.        Mood and Affect: Mood and affect normal.        Speech: Speech normal.        Behavior: Behavior normal. Behavior is cooperative.        Thought Content: Thought content normal.    Visual Acuity Right Eye Distance:   Left Eye Distance:   Bilateral Distance:    Right Eye Near:   Left Eye Near:    Bilateral Near:     UC Couse / Diagnostics / Procedures:    EKG  Radiology No results found.  Procedures Procedures (including critical care time)  UC Diagnoses / Final Clinical Impressions(s)   I have reviewed the triage vital signs and the nursing notes.  Pertinent labs & imaging results that were available during my care of the patient were reviewed by me and considered in my medical decision making (see chart for details).   Final diagnoses:  Community acquired pneumonia, unspecified laterality   Patient advised she is outside the window of treatment for Tamiflu but would benefit from 7-day course of Augmentin.  Prescription sent.  Conservative care recommended.  Return precautions advised.  ED Prescriptions     Medication Sig Dispense Auth. Provider   amoxicillin-clavulanate (AUGMENTIN) 875-125 MG tablet Take 1 tablet by mouth every 12 (twelve) hours for 7  days. 14 tablet Lynden Oxford Scales, [place]      PDMP not reviewed this encounter.  Pending results:  Labs Reviewed - No data to display  Medications  Ordered in UC: Medications - No data to display  Disposition Upon Discharge:  Condition: stable for discharge home Home: take medications as prescribed; routine discharge instructions as discussed; follow up as advised.  Patient presented with an acute illness with associated systemic symptoms and significant discomfort requiring urgent management. In my opinion, this is a condition that a prudent lay person (someone who possesses an average knowledge of health and medicine) may potentially expect to result in complications if not addressed urgently such as respiratory distress, impairment of bodily function or dysfunction of bodily organs.   Routine symptom specific, illness specific and/or disease specific instructions were discussed with the patient and/or caregiver at length.   As such, the patient has been evaluated and assessed, work-up was performed and treatment was provided in alignment with urgent care protocols and evidence based medicine.  Patient/parent/caregiver has been advised that the patient may require follow up for further testing and treatment if the symptoms continue in spite of treatment, as clinically indicated and appropriate.  The patient was tested for COVID-19, Influenza and/or RSV, then the patient/parent/guardian was advised to isolate at home pending the results of his/her diagnostic coronavirus test and potentially longer if they're positive. I have also advised pt that if his/her COVID-19 test returns positive, it's recommended to self-isolate for at least 10 days after symptoms first appeared AND until fever-free for 24 hours without fever reducer AND other symptoms have improved or resolved. Discussed self-isolation recommendations as well as instructions for household member/close contacts as per the [facility] and [place] DHHS, and also gave patient the [place] packet with this information.  Patient/parent/caregiver has been advised to return to the [facility] or PCP in 3-5 days if no  better; to PCP or the Emergency Department if new signs and symptoms develop, or if the current signs or symptoms continue to change or worsen for further workup, evaluation and treatment as clinically indicated and appropriate  The patient will follow up with their current PCP if and as advised. If the patient does not currently have a PCP we will assist them in obtaining one.   The patient may need specialty follow up if the symptoms continue, in spite of conservative treatment and management, for further workup, evaluation, consultation and treatment as clinically indicated and appropriate.  Patient/parent/caregiver verbalized understanding and agreement of plan as discussed.  All questions were addressed during visit.  Please see discharge instructions below for further details of plan.  Discharge Instructions:   Discharge Instructions      Begin Augmentin 1 tablet twice daily for 7 days.  Please follow up with your PCP for unresolved symptoms.   Please remain home from work, school, public places until you have been fever free for 24 hours without the use of antifever medications such as Tylenol or ibuprofen.  Conservative care is recommended at this time.  This includes rest, pushing clear fluids and activity as tolerated.  You may also noticed that your appetite is reduced, this is okay as long as they are drinking plenty of clear fluids.  Acetaminophen (Tylenol): This is a good fever reducer.  If there body temperature rises above 101.5 as measured with a thermometer, it is recommended that you give them 1,000 mg every 6-8 hours until they are temperature falls below 101.5, please not take more than  3,000 mg of acetaminophen either as a separate medication or as in ingredient in an over-the-counter cold/flu preparation within a 24-hour period  Ibuprofen  (Advil, Motrin): This is a good anti-inflammatory medication which addresses aches and pains and, to some degree, congestion in the  nasal passages.  I recommend giving between 400 to 600 mg every 6-8 hours as needed.  Pseudoephedrine (Sudafed): This is a decongestant.  This medication has to be purchased from the pharmacist counter, I recommend giving 2 tablets, 60 mg, 2-3 times a day as needed to relieve runny nose and sinus drainage.  Guaifenesin (Robitussin, Mucinex): This is an expectorant.  This helps break up chest congestion and loosen up thick nasal drainage making phlegm and drainage more liquid and therefore easier to remove.  I recommend being 400 mg three times daily as needed.  Dextromethorphan (any cough medicine with the letters "DM" added to it's name such as Robitussin DM): This is a cough suppressant.  This is often recommended to be taken at nighttime to suppress cough and help children sleep.  Give dosage as directed on the bottle.   Chloraseptic Throat Spray: Spray 5 sprays into affected area every 2 hours, hold for 15 seconds and either swallow or spit it out.  This is a excellent numbing medication because it is a spray, you can put it right where you needed and so sucking on a lozenge and numbing your entire mouth.  Please follow-up within the next 3 to 5 days either with your primary care provider or urgent care if your symptoms do not resolve.  If you do not have a primary care provider, we will assist you in finding one.         Lynden Oxford Scales, [place] 03/28/21 [phone]

## 2021-03-30 ENCOUNTER — Emergency Department (HOSPITAL_BASED_OUTPATIENT_CLINIC_OR_DEPARTMENT_OTHER): Payer: Managed Care, Other (non HMO) | Admitting: Radiology

## 2021-03-30 ENCOUNTER — Encounter (HOSPITAL_BASED_OUTPATIENT_CLINIC_OR_DEPARTMENT_OTHER): Payer: Self-pay | Admitting: *Deleted

## 2021-03-30 ENCOUNTER — Emergency Department (HOSPITAL_BASED_OUTPATIENT_CLINIC_OR_DEPARTMENT_OTHER)
Admission: EM | Admit: 2021-03-30 | Discharge: 2021-03-30 | Disposition: A | Discharge status: Home / Self Care | Payer: Managed Care, Other (non HMO) | Attending: Emergency Medicine | Admitting: Emergency Medicine

## 2021-03-30 ENCOUNTER — Other Ambulatory Visit: Payer: Self-pay

## 2021-03-30 DIAGNOSIS — R0602 Shortness of breath: Secondary | ICD-10-CM | POA: Diagnosis not present

## 2021-03-30 DIAGNOSIS — M791 Myalgia, unspecified site: Secondary | ICD-10-CM | POA: Insufficient documentation

## 2021-03-30 DIAGNOSIS — R519 Headache, unspecified: Secondary | ICD-10-CM | POA: Diagnosis not present

## 2021-03-30 DIAGNOSIS — J189 Pneumonia, unspecified organism: Secondary | ICD-10-CM | POA: Insufficient documentation

## 2021-03-30 LAB — CBC WITH DIFFERENTIAL/PLATELET
Abs Immature Granulocytes: 0.01 10*3/uL (ref 0.00–0.07)
Basophils Absolute: 0 10*3/uL (ref 0.0–0.1)
Basophils Relative: 0 %
Eosinophils Absolute: 0.1 10*3/uL (ref 0.0–0.5)
Eosinophils Relative: 3 %
HCT: 39.4 % (ref 36.0–46.0)
Hemoglobin: 12.9 g/dL (ref 12.0–15.0)
Immature Granulocytes: 0 %
Lymphocytes Relative: 20 %
Lymphs Abs: 1 10*3/uL (ref 0.7–4.0)
MCH: 26.6 pg (ref 26.0–34.0)
MCHC: 32.7 g/dL (ref 30.0–36.0)
MCV: 81.2 fL (ref 80.0–100.0)
Monocytes Absolute: 0.5 10*3/uL (ref 0.1–1.0)
Monocytes Relative: 10 %
Neutro Abs: 3.6 10*3/uL (ref 1.7–7.7)
Neutrophils Relative %: 67 %
Platelets: 275 10*3/uL (ref 150–400)
RBC: 4.85 MIL/uL (ref 3.87–5.11)
RDW: 14.1 % (ref 11.5–15.5)
WBC: 5.3 10*3/uL (ref 4.0–10.5)
nRBC: 0 % (ref 0.0–0.2)

## 2021-03-30 LAB — BASIC METABOLIC PANEL
Anion gap: 9 (ref 5–15)
BUN: 6 mg/dL (ref 6–20)
CO2: 26 mmol/L (ref 22–32)
Calcium: 8.6 mg/dL — ABNORMAL LOW (ref 8.9–10.3)
Chloride: 104 mmol/L (ref 98–111)
Creatinine, Ser: 0.8 mg/dL (ref 0.44–1.00)
GFR, Estimated: >60 mL/min (ref >60)
Glucose, Bld: 90 mg/dL (ref 70–99)
Potassium: 3.3 mmol/L — ABNORMAL LOW (ref 3.5–5.1)
Sodium: 139 mmol/L (ref 135–145)

## 2021-03-30 MED ORDER — AZITHROMYCIN 250 MG PO TABS: 250.0000 mg | ORAL_TABLET | Freq: Every day | ORAL | 0 refills | Status: DC

## 2021-03-30 MED ORDER — ACETAMINOPHEN 500 MG PO TABS
1000.0000 mg | ORAL_TABLET | Freq: Once | ORAL | Status: AC
  Administered 2021-03-30: 1000 mg via ORAL
  Filled 2021-03-30: qty 2

## 2021-03-30 MED ORDER — BENZONATATE 100 MG PO CAPS
100.0000 mg | ORAL_CAPSULE | Freq: Once | ORAL | Status: AC
  Administered 2021-03-30: 100 mg via ORAL
  Filled 2021-03-30: qty 1

## 2021-03-30 MED ORDER — BENZONATATE 100 MG PO CAPS: 100.0000 mg | ORAL_CAPSULE | Freq: Three times a day (TID) | ORAL | 0 refills | Status: DC

## 2021-03-30 NOTE — ED Triage Notes (Signed)
Pt comes in with fever, couth, headache and fatigue. States son was dx Monday with flu at [facility] and she was told she has pneumonia and was started on Augmentin, feels she is not getting better.

## 2021-03-30 NOTE — ED Provider Notes (Signed)
[facility] EMERGENCY DEPT Provider Note   CSN: [id number] Arrival date & time: 03/30/21  1418     History Chief Complaint  Patient presents with   flu like   Pneumonia   Fever    Beverly Ford is a 45 y.o. female.   Pneumonia Associated symptoms include headaches and shortness of breath. Pertinent negatives include no chest pain.  Fever Associated symptoms: congestion, cough, headaches, myalgias and sore throat   Associated symptoms: no chest pain, no nausea and no vomiting    Patient presents with flulike symptoms x1 week.  She started feeling bad for 5 days ago, its been constant and not improving.  Reports she has a cough, body aches, fever, feels short of breath.  She did test positive for the flu Per chart review, she also was diagnosed with a bacterial pneumonia about 2 days ago.  She was started on amoxicillin which has not improved her symptoms.  She has tried Mucinex and Vicks VapoRub which have not particularly improved her symptoms.  Reports that her son has similar symptoms at home that started roughly around the same time.  She did not have any nausea or vomiting, able to tolerate oral intake without complication.  Past Medical History:  Diagnosis Date   GAD (generalized anxiety disorder)    History of diverticulitis of colon    History of DVT of lower extremity 11/2015   per pt post op left bunion LLE dvt and completed blood thinner   History of kidney stones    Left ureteral stone    MDD (major depressive disorder)    Mild obstructive sleep apnea    per study in epic 11-08-2015,  per pt no cpap recommendation   Nephrolithiasis    bilateral nonobstructive    Thoracic outlet syndrome    followed by dr branch @ [facility] -- intermittant symptoms, but no spine related and been referred for second opinion    Patient Active Problem List   Diagnosis Date Noted   Recurrent major depression in remission (HCC) 09/02/2020   Cervical radicular  pain 09/28/2019   Thoracic spine pain 09/28/2019   Moderate episode of recurrent major depressive disorder (HCC) 10/15/2018   Generalized anxiety disorder 10/15/2018   S/P lumbar fusion 12/04/2017   Chronic back pain greater than 3 months duration 11/16/2017   Migraine with aura and without status migrainosus, not intractable 07/04/2017   Calculus of kidney 04/04/2017   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;  Service: Urology;  Laterality: Left;   LAPAROSCOPIC CHOLECYSTECTOMY  07/2015   LUMBAR DISC SURGERY  10/05/2016   L5-- S1   LUMBAR FUSION  01/24/2018   L5-- S1   NASAL SEPTUM SURGERY  2006   TUBAL LIGATION Bilateral 2004   URETEROSCOPY WITH HOLMIUM LASER LITHOTRIPSY  2013     OB History   No obstetric history on file.     Family History  Problem Relation Age of Onset   Diabetes Mother    Cancer Father    Kidney disease Father    Hypertension Father     Social  History   Tobacco Use   Smoking status: Never   Smokeless tobacco: Never  Vaping Use   Vaping Use: Never used  Substance Use Topics   Alcohol use: Yes    Comment: social   Drug use: Never    Home Medications Prior to Admission medications   Medication Sig Start Date End Date Taking? Authorizing Provider  azithromycin (ZITHROMAX) 250 MG tablet Take 1 tablet (250 mg total) by mouth daily. Take first 2 tablets together, then 1 every day until finished. 03/30/21  Yes Theron Arista, PA-C  benzonatate (TESSALON) 100 MG capsule Take 1  capsule (100 mg total) by mouth every 8 (eight) hours. 03/30/21  Yes Theron Arista, PA-C  amoxicillin-clavulanate (AUGMENTIN) 875-125 MG tablet Take 1 tablet by mouth every 12 (twelve) hours for 7 days. 03/28/21 04/04/21  Theadora Rama Scales, PA-C  DULoxetine (CYMBALTA) 20 MG capsule Take 1 capsule by mouth daily. 08/08/20   [provider]  hydrOXYzine (ATARAX/VISTARIL) 25 MG tablet Take 25 mg by mouth 2 (two) times daily. 08/16/20   [provider]  ibuprofen (ADVIL) 800 MG tablet Take 1 tablet (800 mg total) by mouth every 6 (six) hours as needed. 08/22/20   Candelaria Stagers, DPM  ipratropium (ATROVENT) 0.06 % nasal spray Place 2 sprays into both nostrils 3 (three) times daily. Patient taking differently: Place 2 sprays into both nostrils 3 (three) times daily as needed. 04/27/19   Bing Neighbors, FNP  ketorolac (TORADOL) 10 MG tablet Take 10 mg by mouth every 6 (six) hours as needed. 03/22/20   [provider]  levocetirizine (XYZAL) 5 MG tablet Take 1 tablet (5 mg total) by mouth every evening. 04/27/19   Bing Neighbors, FNP  methocarbamol (ROBAXIN) 500 MG tablet Take 500 mg by mouth 3 (three) times daily. 01/01/20   [provider]  ondansetron (ZOFRAN ODT) 4 MG disintegrating tablet  ODT q4 hours prn nausea/vomit 03/03/20   Melene Plan, DO  phenazopyridine (PYRIDIUM) 200 MG tablet Take 1 tablet (200 mg total) by mouth 3 (three) times daily as needed for pain. 04/07/20   Crist Fat, MD  potassium chloride (KLOR-CON) 10 MEQ tablet Take 10 mEq by mouth 2 (two) times daily.    [provider]  predniSONE (DELTASONE) 20 MG tablet Take 2 tablets (40 mg total) by mouth daily with breakfast. 06/15/20   Bing Neighbors, FNP  tamsulosin (FLOMAX) 0.4 MG CAPS capsule Take 1 capsule (0.4 mg total) by mouth daily after supper. Take until the stent is removed. 04/07/20   Crist Fat, MD  traMADol (ULTRAM) 50 MG tablet Take 1-2 tablets (50-100 mg  total) by mouth every 6 (six) hours as needed for moderate pain. 04/07/20   Crist Fat, MD  traZODone (DESYREL) 50 MG tablet Take 50 mg by mouth at bedtime. 04/09/19 04/07/20  [provider]  triamcinolone (KENALOG) 0.025 % ointment Apply 1 application topically 2 (two) times daily. 06/15/20   Bing Neighbors, FNP    Allergies    Other, Betadine [povidone iodine], Iodinated diagnostic agents, Shellfish allergy, and Topiramate  Review of Systems   Review of Systems  Constitutional:  Positive for fever.  HENT:  Positive for congestion and sore throat.   Respiratory:  Positive for cough and shortness of breath.   Cardiovascular:  Negative for chest pain.  Gastrointestinal:  Negative for nausea and vomiting.  Musculoskeletal:  Positive for myalgias.  Neurological:  Positive for headaches.   Physical Exam Updated  Vital Signs BP 121/62   Pulse 76   Temp 99.5 F (37.5 C)   Resp 16   Ht 5\' 3"  (1.6 m)   Wt 76.2 kg   LMP 03/02/2021 (Approximate)   SpO2 99%   BMI 29.76 kg/m   Physical Exam Vitals and nursing note reviewed. Exam conducted with a chaperone present.  Constitutional:      Appearance: Normal appearance.  HENT:     Head: Normocephalic.     Nose: Congestion present.     Mouth/Throat:     Pharynx: Posterior oropharyngeal erythema present.  Eyes:     Extraocular Movements: Extraocular movements intact.     Pupils: Pupils are equal, round, and reactive to light.  Cardiovascular:     Rate and Rhythm: Normal rate and regular rhythm.  Pulmonary:     Effort: Pulmonary effort is normal.     Breath sounds: Normal breath sounds.     Comments: Lungs CTA bilaterally. No accessory muscle use. Speaking in complete sentences.  Abdominal:     General: Abdomen is flat.     Palpations: Abdomen is soft.  Musculoskeletal:     Cervical back: Normal range of motion.  Neurological:     Mental Status: She is alert.  Psychiatric:        Mood and Affect: Mood  normal.    ED Results / Procedures / Treatments   Labs (all labs ordered are listed, but only abnormal results are displayed) Labs Reviewed  CBC WITH DIFFERENTIAL/PLATELET  BASIC METABOLIC PANEL    EKG None  Radiology DG Chest 2 View  Result Date: 03/30/2021 CLINICAL DATA:  Fever, cough and headache EXAM: CHEST - 2 VIEW COMPARISON:  None. FINDINGS: Normal heart size and vascularity. Trace basilar atelectasis on the left. Tiny effusion on the left blunting the left costophrenic angle. Right lung is clear. Negative for edema or pneumothorax. Trachea midline. Nonobstructive bowel gas pattern. IMPRESSION: Trace left base atelectasis and tiny left effusion. Electronically Signed   By: 14/11/2020.  Shick M.D.   On: 03/30/2021 15:12    Procedures Procedures   Medications Ordered in ED Medications  benzonatate (TESSALON) capsule 100 mg (100 mg Oral Given 03/30/21 1804)  acetaminophen (TYLENOL) tablet 1,000 mg (1,000 mg Oral Given 03/30/21 1804)    ED Course  I have reviewed the triage vital signs and the nursing notes.  Pertinent labs & imaging results that were available during my care of the patient were reviewed by me and considered in my medical decision making (see chart for details).    MDM Rules/Calculators/A&P                           Patient is stable vitals, she is nontoxic-appearing.  No tachypnea or hypoxia, no signs of impending respiratory distress.  Lungs are clear to auscultation, not having any chest pain or tachycardia.    She does have recent history of pneumonia, only on Augmentin and has only taken it for 2 days.  Did check some basic labs to make sure no worsening findings or significant leukocytosis, all of it came back relatively benign.  X-ray was performed in triage, consistent with underlying pneumonia.  We will add azithromycin and have her continue taking the Augmentin.  Return precautions given, patient discharged in stable condition.  Final Clinical  Impression(s) / ED Diagnoses Final diagnoses:  Community acquired pneumonia, unspecified laterality    Rx / DC Orders ED Discharge Orders  Ordered    benzonatate (TESSALON) 100 MG capsule  Every 8 hours        03/30/21 1724    azithromycin (ZITHROMAX) 250 MG tablet  Daily        03/30/21 1724             Theron Arista, [place] 03/30/21 1811    Tegeler, Canary Brim, MD 03/30/21 2308

## 2021-03-30 NOTE — ED Notes (Signed)
Pt d/c home per MD order. Discharge summary reviewed, verbalize understanding. Ambulatory off unit/. No s/s of acute distress noted at discharge 

## 2021-03-30 NOTE — Discharge Instructions (Addendum)
Keep taking the Augmentin twice daily until completing the course.  Take azithromycin twice on the first day.  After that take 1 pill daily until you run out of the prescription.  You can also take the [facility] as needed for cough.

## 2021-06-06 ENCOUNTER — Other Ambulatory Visit: Payer: Self-pay | Admitting: Family Medicine

## 2021-06-06 DIAGNOSIS — N644 Mastodynia: Secondary | ICD-10-CM

## 2021-06-19 ENCOUNTER — Ambulatory Visit
Admission: RE | Admit: 2021-06-19 | Discharge: 2021-06-19 | Disposition: A | Discharge status: Home / Self Care | Payer: Managed Care, Other (non HMO) | Source: Ambulatory Visit | Attending: Family Medicine | Admitting: Family Medicine

## 2021-06-19 ENCOUNTER — Ambulatory Visit: Payer: Managed Care, Other (non HMO)

## 2021-06-19 DIAGNOSIS — N644 Mastodynia: Secondary | ICD-10-CM

## 2021-06-21 ENCOUNTER — Ambulatory Visit: Payer: Managed Care, Other (non HMO) | Admitting: Podiatry

## 2021-06-21 ENCOUNTER — Ambulatory Visit (INDEPENDENT_AMBULATORY_CARE_PROVIDER_SITE_OTHER): Payer: Managed Care, Other (non HMO)

## 2021-06-21 ENCOUNTER — Other Ambulatory Visit: Payer: Self-pay

## 2021-06-21 DIAGNOSIS — M84375A Stress fracture, left foot, initial encounter for fracture: Secondary | ICD-10-CM

## 2021-06-21 DIAGNOSIS — Z9889 Other specified postprocedural states: Secondary | ICD-10-CM

## 2021-06-21 DIAGNOSIS — M722 Plantar fascial fibromatosis: Secondary | ICD-10-CM

## 2021-06-27 NOTE — Progress Notes (Signed)
?Subjective:  ?Patient ID: Beverly Ford, female    DOB: [birth year],  MRN: [id number] ? ?Chief Complaint  ?Patient presents with  ? Toe Pain  ? ? ?46 y.o. female presents with the above complaint.  Patient presents with complaint of right heel pain that has been going on for some quite some time.  Patient states painful to touch is progressive gotten worse.  She states hurts with ambulation.  Her pain scale 7 out of 10 dull aching nature.  She has secondary complaint of left second metatarsal pain.  There is some swelling and tenderness associated with it.  She has not seen anyone as prior to seeing me.  The left side is worse than right side right now.  She states her pain scale is 7 out of 10 achy in nature as well.  She denies any other acute issues. ? ? ?Review of Systems: Negative except as noted in the HPI. Denies N/V/F/Ch. ? ?Past Medical History:  ?Diagnosis Date  ? GAD (generalized anxiety disorder)   ? History of diverticulitis of colon   ? 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 per pt post op left bunion LLE dvt and completed blood thinner  ? History of kidney stones   ? Left ureteral stone   ? MDD (major depressive disorder)   ? Mild obstructive sleep apnea   ? per study in epic 11-08-2015,  per pt no cpap recommendation  ? Nephrolithiasis   ? bilateral nonobstructive   ? Thoracic outlet syndrome   ? followed by dr branch @ [facility] -- intermittant symptoms, but no spine related and been referred for second opinion  ? ? ?Current Outpatient Medications:  ?  azithromycin (ZITHROMAX) 250 MG tablet, Take 1 tablet (250 mg total) by mouth daily. Take first 2 tablets together, then 1 every day until finished., Disp: 6 tablet, Rfl: 0 ?  benzonatate (TESSALON) 100 MG capsule, Take 1 capsule (100 mg total) by mouth every 8 (eight) hours., Disp: 21 capsule, Rfl: 0 ?  DULoxetine (CYMBALTA) 20 MG capsule, Take 1 capsule by mouth daily., Disp: , Rfl:  ?  hydrOXYzine (ATARAX/VISTARIL) 25 MG tablet, Take 25  mg by mouth 2 (two) times daily., Disp: , Rfl:  ?  ibuprofen (ADVIL) 800 MG tablet, Take 1 tablet (800 mg total) by mouth every 6 (six) hours as needed., Disp: 60 tablet, Rfl: 1 ?  ipratropium (ATROVENT) 0.06 % nasal spray, Place 2 sprays into both nostrils 3 (three) times daily. (Patient taking differently: Place 2 sprays into both nostrils 3 (three) times daily as needed.), Disp: 15 mL, Rfl: 1 ?  ketorolac (TORADOL) 10 MG tablet, Take 10 mg by mouth every 6 (six) hours as needed., Disp: , Rfl:  ?  levocetirizine (XYZAL) 5 MG tablet, Take 1 tablet (5 mg total) by mouth every evening., Disp: 30 tablet, Rfl: 0 ?  methocarbamol (ROBAXIN) 500 MG tablet, Take 500 mg by mouth 3 (three) times daily., Disp: , Rfl:  ?  ondansetron (ZOFRAN ODT) 4 MG disintegrating tablet, 4mg  ODT q4 hours prn nausea/vomit, Disp: 20 tablet, Rfl: 0 ?  phenazopyridine (PYRIDIUM) 200 MG tablet, Take 1 tablet (200 mg total) by mouth 3 (three) times daily as needed for pain., Disp: 10 tablet, Rfl: 0 ?  potassium chloride (KLOR-CON) 10 MEQ tablet, Take 10 mEq by mouth 2 (two) times daily., Disp: , Rfl:  ?  predniSONE (DELTASONE) 20 MG tablet, Take 2 tablets (40 mg total) by mouth daily with breakfast.,  Disp: 10 tablet, Rfl: 0 ?  tamsulosin (FLOMAX) 0.4 MG CAPS capsule, Take 1 capsule (0.4 mg total) by mouth daily after supper. Take until the stent is removed., Disp: , Rfl:  ?  traMADol (ULTRAM) 50 MG tablet, Take 1-2 tablets (50-100 mg total) by mouth every 6 (six) hours as needed for moderate pain., Disp: 15 tablet, Rfl: 0 ?  traZODone (DESYREL) 50 MG tablet, Take 50 mg by mouth at bedtime., Disp: , Rfl:  ?  triamcinolone (KENALOG) 0.025 % ointment, Apply 1 application topically 2 (two) times daily., Disp: 160 g, Rfl: 0 ? ?Social History  ? ?Tobacco Use  ?Smoking Status Never  ?Smokeless Tobacco Never  ? ? ?Allergies  ?Allergen Reactions  ? Other   ?  Other reaction(s): Other ?IVP dye  Unknown reaction ?IVP dye  Unknown reaction ?  ? Betadine  [Povidone Iodine] Hives  ?  blisters  ? Iodinated Contrast Media   ?  Other reaction(s): Other (See Comments) ?IVP dye  Unknown reaction  ? Shellfish Allergy Hives  ?  All shellfish  ? Topiramate   ?  Other reaction(s): Other ?kidney stones ?kidney stones ?  ? ?Objective:  ?There were no vitals filed for this visit. ?There is no height or weight on file to calculate BMI. ?Constitutional Well developed. ?Well nourished.  ?Vascular Dorsalis pedis pulses palpable bilaterally. ?Posterior tibial pulses palpable bilaterally. ?Capillary refill normal to all digits.  ?No cyanosis or clubbing noted. ?Pedal hair growth normal.  ?Neurologic Normal speech. ?Oriented to person, place, and time. ?Epicritic sensation to light touch grossly present bilaterally.  ?Dermatologic Nails well groomed and normal in appearance. ?No open wounds. ?No skin lesions.  ?Orthopedic: Normal joint ROM without pain or crepitus bilaterally. ?No visible deformities. ?Tender to palpation at the calcaneal tuber right. ?No pain with calcaneal squeeze right. ?Ankle ROM diminished range of motion right. ?Silfverskiold Test: positive right. ? ?Pain along the course of the left second metatarsal.  No pain with second metatarsophalangeal joint range of motion.  No deep intra-articular pain noted.  Pain only localized to second metatarsal  ? ?Radiographs: 3 views of skeletally mature adult left foot: No stress fracture noted no acute osseous break noted.  Previous bunionectomy surgery noted.  Osteoarthritis of the first tarsometatarsal joint noted.  Plantar heel spur noted pes planovalgus foot structure noted ?Assessment:  ? ?1. Stress fracture, left foot, initial encounter for fracture   ?2. Plantar fasciitis, right   ? ?Plan:  ?Patient was evaluated and treated and all questions answered. ? ?Second metatarsal possible stress fracture ?-Radiographically I am not able to appreciate a stress fracture however patient is clinically pain is brought on the course  of the metatarsal.  It could also be a bursitis.  I will treat it as a stress fracture I believe patient would benefit from cam boot immobilization.  Cam boot was dispensed. ?-If there is no improvement we will discuss MRI during next visit ? ?Plantar Fasciitis, left ?- XR reviewed as above.  ?- Educated on icing and stretching. Instructions given.  ?- Injection delivered to the plantar fascia as below. ?- DME: Plantar fascial brace dispensed to support the medial longitudinal arch of the foot and offload pressure from the heel and prevent arch collapse during weightbearing ?- Pharmacologic management: None ? ?Procedure: Injection Tendon/Ligament ?Location: Left plantar fascia at the glabrous junction; medial approach. ?Skin Prep: alcohol ?Injectate: 0.5 cc 0.5% marcaine plain, 0.5 cc of 1% Lidocaine, 0.5 cc kenalog 10. ?Disposition: Patient tolerated  procedure well. Injection site dressed with a band-aid. ? ?No follow-ups on file. ?

## 2021-07-19 ENCOUNTER — Other Ambulatory Visit: Payer: Self-pay

## 2021-07-19 ENCOUNTER — Ambulatory Visit: Payer: Managed Care, Other (non HMO) | Admitting: Podiatry

## 2021-07-19 ENCOUNTER — Encounter: Payer: Self-pay | Admitting: Podiatry

## 2021-07-19 DIAGNOSIS — M722 Plantar fascial fibromatosis: Secondary | ICD-10-CM

## 2021-07-19 DIAGNOSIS — M778 Other enthesopathies, not elsewhere classified: Secondary | ICD-10-CM | POA: Diagnosis not present

## 2021-07-19 NOTE — Progress Notes (Signed)
?Subjective:  ?Patient ID: Beverly Ford, female    DOB: 13-Jan-1976,  MRN: [id number] ? ?Chief Complaint  ?Patient presents with  ? Foot Pain  ?  Left foot pain ?Pt stated that she is doing better she feel like the boot did help no new concerns   ? ? ?46 y.o. female presents with the above complaint.  Patient presents for follow-up of right Planter fasciitis.  She states she is doing a lot better her pain is resolved on the right side with a steroid injection.  Her left side is about 70% better.  She would like to know if she can do a steroid injection.  She denies any other acute complaints. ? ? ?Review of Systems: Negative except as noted in the HPI. Denies N/V/F/Ch. ? ?Past Medical History:  ?Diagnosis Date  ? GAD (generalized anxiety disorder)   ? History of diverticulitis of colon   ? 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 per pt post op left bunion LLE dvt and completed blood thinner  ? History of kidney stones   ? Left ureteral stone   ? MDD (major depressive disorder)   ? Mild obstructive sleep apnea   ? per study in epic 11-08-2015,  per pt no cpap recommendation  ? Nephrolithiasis   ? bilateral nonobstructive   ? Thoracic outlet syndrome   ? followed by dr branch [facility] -- intermittant symptoms, but no spine related and been referred for second opinion  ? ? ?Current Outpatient Medications:  ?  azithromycin (ZITHROMAX) 250 MG tablet, Take 1 tablet (250 mg total) by mouth daily. Take first 2 tablets together, then 1 every day until finished., Disp: 6 tablet, Rfl: 0 ?  benzonatate (TESSALON) 100 MG capsule, Take 1 capsule (100 mg total) by mouth every 8 (eight) hours., Disp: 21 capsule, Rfl: 0 ?  DULoxetine (CYMBALTA) 20 MG capsule, Take 1 capsule by mouth daily., Disp: , Rfl:  ?  hydrOXYzine (ATARAX/VISTARIL) 25 MG tablet, Take 25 mg by mouth 2 (two) times daily., Disp: , Rfl:  ?  ibuprofen (ADVIL) 800 MG tablet, Take 1 tablet (800 mg total) by mouth every 6 (six) hours as needed., Disp: 60  tablet, Rfl: 1 ?  ipratropium (ATROVENT) 0.06 % nasal spray, Place 2 sprays into both nostrils 3 (three) times daily. (Patient taking differently: Place 2 sprays into both nostrils 3 (three) times daily as needed.), Disp: 15 mL, Rfl: 1 ?  ketorolac (TORADOL) 10 MG tablet, Take 10 mg by mouth every 6 (six) hours as needed., Disp: , Rfl:  ?  levocetirizine (XYZAL) 5 MG tablet, Take 1 tablet (5 mg total) by mouth every evening., Disp: 30 tablet, Rfl: 0 ?  methocarbamol (ROBAXIN) 500 MG tablet, Take 500 mg by mouth 3 (three) times daily., Disp: , Rfl:  ?  ondansetron (ZOFRAN ODT) 4 MG disintegrating tablet, 4mg  ODT q4 hours prn nausea/vomit, Disp: 20 tablet, Rfl: 0 ?  phenazopyridine (PYRIDIUM) 200 MG tablet, Take 1 tablet (200 mg total) by mouth 3 (three) times daily as needed for pain., Disp: 10 tablet, Rfl: 0 ?  potassium chloride (KLOR-CON) 10 MEQ tablet, Take 10 mEq by mouth 2 (two) times daily., Disp: , Rfl:  ?  predniSONE (DELTASONE) 20 MG tablet, Take 2 tablets (40 mg total) by mouth daily with breakfast., Disp: 10 tablet, Rfl: 0 ?  tamsulosin (FLOMAX) 0.4 MG CAPS capsule, Take 1 capsule (0.4 mg total) by mouth daily after supper. Take until the stent is  removed., Disp: , Rfl:  ?  traMADol (ULTRAM) 50 MG tablet, Take 1-2 tablets (50-100 mg total) by mouth every 6 (six) hours as needed for moderate pain., Disp: 15 tablet, Rfl: 0 ?  traZODone (DESYREL) 50 MG tablet, Take 50 mg by mouth at bedtime., Disp: , Rfl:  ?  triamcinolone (KENALOG) 0.025 % ointment, Apply 1 application topically 2 (two) times daily., Disp: 160 g, Rfl: 0 ? ?Social History  ? ?Tobacco Use  ?Smoking Status Never  ?Smokeless Tobacco Never  ? ? ?Allergies  ?Allergen Reactions  ? Other   ?  Other reaction(s): Other ?IVP dye  Unknown reaction ?IVP dye  Unknown reaction ?  ? Betadine [Povidone Iodine] Hives  ?  blisters  ? Iodinated Contrast Media   ?  Other reaction(s): Other (See Comments) ?IVP dye  Unknown reaction  ? Shellfish Allergy Hives  ?   All shellfish  ? Topiramate   ?  Other reaction(s): Other ?kidney stones ?kidney stones ?  ? ?Objective:  ?There were no vitals filed for this visit. ?There is no height or weight on file to calculate BMI. ?Constitutional Well developed. ?Well nourished.  ?Vascular Dorsalis pedis pulses palpable bilaterally. ?Posterior tibial pulses palpable bilaterally. ?Capillary refill normal to all digits.  ?No cyanosis or clubbing noted. ?Pedal hair growth normal.  ?Neurologic Normal speech. ?Oriented to person, place, and time. ?Epicritic sensation to light touch grossly present bilaterally.  ?Dermatologic Nails well groomed and normal in appearance. ?No open wounds. ?No skin lesions.  ?Orthopedic: Normal joint ROM without pain or crepitus bilaterally. ?No visible deformities. ?Tender to palpation at the calcaneal tuber right. ?No pain with calcaneal squeeze right. ?Ankle ROM diminished range of motion right. ?Silfverskiold Test: positive right. ? ?Pain along the course of the left second metatarsal.  No pain with second metatarsophalangeal joint range of motion.  No deep intra-articular pain noted.  Pain only localized to second metatarsal  ? ?Radiographs: 3 views of skeletally mature adult left foot: No stress fracture noted no acute osseous break noted.  Previous bunionectomy surgery noted.  Osteoarthritis of the first tarsometatarsal joint noted.  Plantar heel spur noted pes planovalgus foot structure noted ?Assessment:  ? ?1. Capsulitis of left foot   ?2. Plantar fasciitis, right   ? ? ?Plan:  ?Patient was evaluated and treated and all questions answered. ? ?Second metatarsal possible stress fracture versus bursitis ?-I believe likely this is bursitis given her pain is resolving and is about 70% better.  She still has some residual pain.  Given the amount of pain that she is having I believe she will benefit from a steroid injection I discussed this with the patient she states understanding.  She would like to proceed  with a steroid injection ?-A steroid injection was performed at left forefoot using 1% plain Lidocaine and 10 mg of Kenalog. This was well tolerated. ? ? ?Plantar Fasciitis, right ?-Clinically healed and is doing well with a steroid injection.  I encouraged shoe gear modification orthotics wear.  If any foot and ankle issues arises from the plantar fasciitis have asked her to come see me.  She states understanding ? ?No follow-ups on file. ?

## 2021-08-18 ENCOUNTER — Ambulatory Visit (INDEPENDENT_AMBULATORY_CARE_PROVIDER_SITE_OTHER): Payer: Managed Care, Other (non HMO) | Admitting: Podiatry

## 2021-08-18 DIAGNOSIS — M21861 Other specified acquired deformities of right lower leg: Secondary | ICD-10-CM | POA: Diagnosis not present

## 2021-08-18 DIAGNOSIS — M722 Plantar fascial fibromatosis: Secondary | ICD-10-CM

## 2021-08-24 NOTE — Progress Notes (Signed)
?Subjective:  ?Patient ID: Beverly Ford, female    DOB: 24-Nov-1975,  MRN: [id number] ? ?Chief Complaint  ?Patient presents with  ? capsulitis   ?  Pt stated  that she is having some pain in her right heel   ? ? ?46 y.o. female presents with the above complaint.  Patient presents with pain to the right heel.  She states it started to cause her a lot of pain and started hurting again.  She went to [place] and did a lot of walking.  She states since then her heel pain has been causing a lot of issues.  She has not seen anyone else prior to seeing me.  She denies any other acute complaints.  She would like to discuss treatment options for Planter fasciitis ? ? ?Review of Systems: Negative except as noted in the HPI. Denies N/V/F/Ch. ? ?Past Medical History:  ?Diagnosis Date  ? GAD (generalized anxiety disorder)   ? History of diverticulitis of colon   ? 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 per pt post op left bunion LLE dvt and completed blood thinner  ? History of kidney stones   ? Left ureteral stone   ? MDD (major depressive disorder)   ? Mild obstructive sleep apnea   ? per study in epic 11-08-2015,  per pt no cpap recommendation  ? Nephrolithiasis   ? bilateral nonobstructive   ? Thoracic outlet syndrome   ? followed by dr branch @ [facility] -- intermittant symptoms, but no spine related and been referred for second opinion  ? ? ?Current Outpatient Medications:  ?  azithromycin (ZITHROMAX) 250 MG tablet, Take 1 tablet (250 mg total) by mouth daily. Take first 2 tablets together, then 1 every day until finished., Disp: 6 tablet, Rfl: 0 ?  benzonatate (TESSALON) 100 MG capsule, Take 1 capsule (100 mg total) by mouth every 8 (eight) hours., Disp: 21 capsule, Rfl: 0 ?  DULoxetine (CYMBALTA) 20 MG capsule, Take 1 capsule by mouth daily., Disp: , Rfl:  ?  hydrOXYzine (ATARAX/VISTARIL) 25 MG tablet, Take 25 mg by mouth 2 (two) times daily., Disp: , Rfl:  ?  ibuprofen (ADVIL) 800 MG tablet, Take 1 tablet  (800 mg total) by mouth every 6 (six) hours as needed., Disp: 60 tablet, Rfl: 1 ?  ipratropium (ATROVENT) 0.06 % nasal spray, Place 2 sprays into both nostrils 3 (three) times daily. (Patient taking differently: Place 2 sprays into both nostrils 3 (three) times daily as needed.), Disp: 15 mL, Rfl: 1 ?  ketorolac (TORADOL) 10 MG tablet, Take 10 mg by mouth every 6 (six) hours as needed., Disp: , Rfl:  ?  levocetirizine (XYZAL) 5 MG tablet, Take 1 tablet (5 mg total) by mouth every evening., Disp: 30 tablet, Rfl: 0 ?  methocarbamol (ROBAXIN) 500 MG tablet, Take 500 mg by mouth 3 (three) times daily., Disp: , Rfl:  ?  ondansetron (ZOFRAN ODT) 4 MG disintegrating tablet, 4mg  ODT q4 hours prn nausea/vomit, Disp: 20 tablet, Rfl: 0 ?  phenazopyridine (PYRIDIUM) 200 MG tablet, Take 1 tablet (200 mg total) by mouth 3 (three) times daily as needed for pain., Disp: 10 tablet, Rfl: 0 ?  potassium chloride (KLOR-CON) 10 MEQ tablet, Take 10 mEq by mouth 2 (two) times daily., Disp: , Rfl:  ?  predniSONE (DELTASONE) 20 MG tablet, Take 2 tablets (40 mg total) by mouth daily with breakfast., Disp: 10 tablet, Rfl: 0 ?  tamsulosin (FLOMAX) 0.4 MG CAPS capsule,  Take 1 capsule (0.4 mg total) by mouth daily after supper. Take until the stent is removed., Disp: , Rfl:  ?  traMADol (ULTRAM) 50 MG tablet, Take 1-2 tablets (50-100 mg total) by mouth every 6 (six) hours as needed for moderate pain., Disp: 15 tablet, Rfl: 0 ?  traZODone (DESYREL) 50 MG tablet, Take 50 mg by mouth at bedtime., Disp: , Rfl:  ?  triamcinolone (KENALOG) 0.025 % ointment, Apply 1 application topically 2 (two) times daily., Disp: 160 g, Rfl: 0 ? ?Social History  ? ?Tobacco Use  ?Smoking Status Never  ?Smokeless Tobacco Never  ? ? ?Allergies  ?Allergen Reactions  ? Other   ?  Other reaction(s): Other ?IVP dye  Unknown reaction ?IVP dye  Unknown reaction ?  ? Betadine [Povidone Iodine] Hives  ?  blisters  ? Iodinated Contrast Media   ?  Other reaction(s): Other (See  Comments) ?IVP dye  Unknown reaction  ? Shellfish Allergy Hives  ?  All shellfish  ? Topiramate   ?  Other reaction(s): Other ?kidney stones ?kidney stones ?  ? ?Objective:  ?There were no vitals filed for this visit. ?There is no height or weight on file to calculate BMI. ?Constitutional Well developed. ?Well nourished.  ?Vascular Dorsalis pedis pulses palpable bilaterally. ?Posterior tibial pulses palpable bilaterally. ?Capillary refill normal to all digits.  ?No cyanosis or clubbing noted. ?Pedal hair growth normal.  ?Neurologic Normal speech. ?Oriented to person, place, and time. ?Epicritic sensation to light touch grossly present bilaterally.  ?Dermatologic Nails well groomed and normal in appearance. ?No open wounds. ?No skin lesions.  ?Orthopedic: Normal joint ROM without pain or crepitus bilaterally. ?No visible deformities. ?Tender to palpation at the calcaneal tuber right. ?No pain with calcaneal squeeze right. ?Anke ROM diminished range of motion right. ?Silfverskiold Test: positive right.  ? ?Radiographs: None ? ?Assessment:  ? ?1. Plantar fasciitis, right   ?2. Gastrocnemius equinus, right   ? ?Plan:  ?Patient was evaluated and treated and all questions answered. ? ?Plantar Fasciitis, right with underlying gastrocnemius equinus ?- XR reviewed as above.  ?- Educated on icing and stretching. Instructions given.  ?- Injection delivered to the plantar fascia as below. ?- DME: Plantar fascial brace dispensed to support the medial longitudinal arch of the foot and offload pressure from the heel and prevent arch collapse during weightbearing ?- Pharmacologic management: None ? ?Procedure: Injection Tendon/Ligament ?Location: Right plantar fascia at the glabrous junction; medial approach. ?Skin Prep: alcohol ?Injectate: 0.5 cc 0.5% marcaine plain, 0.5 cc of 1% Lidocaine, 0.5 cc kenalog 10. ?Disposition: Patient tolerated procedure well. Injection site dressed with a band-aid. ? ?No follow-ups on file. ?

## 2021-09-05 ENCOUNTER — Emergency Department (HOSPITAL_BASED_OUTPATIENT_CLINIC_OR_DEPARTMENT_OTHER)
Admission: EM | Admit: 2021-09-05 | Discharge: 2021-09-06 | Disposition: A | Discharge status: Home / Self Care | Payer: 59 | Attending: Emergency Medicine | Admitting: Emergency Medicine

## 2021-09-05 ENCOUNTER — Encounter (HOSPITAL_BASED_OUTPATIENT_CLINIC_OR_DEPARTMENT_OTHER): Payer: Self-pay

## 2021-09-05 DIAGNOSIS — R110 Nausea: Secondary | ICD-10-CM | POA: Diagnosis not present

## 2021-09-05 DIAGNOSIS — R1012 Left upper quadrant pain: Secondary | ICD-10-CM | POA: Insufficient documentation

## 2021-09-05 LAB — COMPREHENSIVE METABOLIC PANEL
ALT: 11 U/L (ref 0–44)
AST: 15 U/L (ref 15–41)
Albumin: 4 g/dL (ref 3.5–5.0)
Alkaline Phosphatase: 86 U/L (ref 38–126)
Anion gap: 11 (ref 5–15)
BUN: 11 mg/dL (ref 6–20)
CO2: 26 mmol/L (ref 22–32)
Calcium: 9.1 mg/dL (ref 8.9–10.3)
Chloride: 100 mmol/L (ref 98–111)
Creatinine, Ser: 0.85 mg/dL (ref 0.44–1.00)
GFR, Estimated: >60 mL/min (ref >60)
Glucose, Bld: 101 mg/dL — ABNORMAL HIGH (ref 70–99)
Potassium: 3.4 mmol/L — ABNORMAL LOW (ref 3.5–5.1)
Sodium: 137 mmol/L (ref 135–145)
Total Bilirubin: 0.4 mg/dL (ref 0.3–1.2)
Total Protein: 7.3 g/dL (ref 6.5–8.1)

## 2021-09-05 LAB — LIPASE, BLOOD: Lipase: <10 U/L — ABNORMAL LOW (ref 11–51)

## 2021-09-05 LAB — CBC
HCT: 40.3 % (ref 36.0–46.0)
Hemoglobin: 13.2 g/dL (ref 12.0–15.0)
MCH: 27.3 pg (ref 26.0–34.0)
MCHC: 32.8 g/dL (ref 30.0–36.0)
MCV: 83.4 fL (ref 80.0–100.0)
Platelets: 297 10*3/uL (ref 150–400)
RBC: 4.83 MIL/uL (ref 3.87–5.11)
RDW: 13.9 % (ref 11.5–15.5)
WBC: 10.5 10*3/uL (ref 4.0–10.5)
nRBC: 0 % (ref 0.0–0.2)

## 2021-09-05 LAB — URINALYSIS, ROUTINE W REFLEX MICROSCOPIC
Bilirubin Urine: NEGATIVE
Glucose, UA: NEGATIVE mg/dL
Hgb urine dipstick: NEGATIVE
Ketones, ur: NEGATIVE mg/dL
Nitrite: NEGATIVE
Protein, ur: NEGATIVE mg/dL
Specific Gravity, Urine: 1.017 (ref 1.005–1.030)
pH: 5.5 (ref 5.0–8.0)

## 2021-09-05 LAB — PREGNANCY, URINE: Preg Test, Ur: NEGATIVE

## 2021-09-05 NOTE — ED Triage Notes (Signed)
Pt states that around 1pm she began to have epigastric abd with some nausea, pain goes to LUQ, hx of pancreatitis and kidney stones. Pt also reports dark urine and dysuria today, denies fevers.  ?

## 2021-09-06 ENCOUNTER — Emergency Department (HOSPITAL_BASED_OUTPATIENT_CLINIC_OR_DEPARTMENT_OTHER): Payer: 59

## 2021-09-06 MED ORDER — ONDANSETRON 4 MG PO TBDP
8.0000 mg | ORAL_TABLET | Freq: Once | ORAL | Status: AC
  Administered 2021-09-06: 8 mg via ORAL
  Filled 2021-09-06: qty 2

## 2021-09-06 NOTE — Discharge Instructions (Signed)
Take Tylenol 1000 mg rotated with ibuprofen 600 mg every 4 hours as needed for pain. ? ?Continue using Zofran as before as needed for nausea. ? ?Drink plenty of fluids and get plenty of rest. ? ?Return to the emergency department if you develop high fever, worsening abdominal pain, bloody stools, or other new and concerning symptoms. ?

## 2021-09-06 NOTE — ED Notes (Signed)
Pt verbalizes understanding of discharge instructions. Opportunity for questioning and answers were provided. Pt discharged from ED to home.   ? ?

## 2021-09-06 NOTE — ED Provider Notes (Signed)
?[facility] EM[facility]PT ?Provider Note ? ? ?CSN: [id number] ?Arrival date & time: 09/05/21  1911 ? ?  ? ?History ? ?Chief Complaint  ?Patient presents with  ? Abdominal Pain  ? ? ?Beverly Ford is a 46 y.o. female. ? ?Patient is a 46 year old female with past medical history of pancreatitis, prior UTIs, cholecystectomy, tubal ligation.  Patient presenting today with complaints of abdominal pain.  This started this afternoon at approximately 1 PM.  She describes a constant ache to the left upper quadrant with associated nausea, but no vomiting.  She denies any fevers or chills.  She denies any bowel or bladder complaints.  She reports having kidney stones in the past, but this feels different. ? ?The history is provided by the patient.  ?Abdominal Pain ?Pain location:  LUQ ?Pain quality: aching   ?Pain radiates to:  Does not radiate ?Pain severity:  Moderate ?Timing:  Constant ?Progression:  Worsening ?Chronicity:  New ?Relieved by:  Nothing ?Worsened by:  Position changes and palpation ? ?  ? ?Home Medications ?Prior to Admission medications   ?Medication Sig Start Date End Date Taking? Authorizing Provider  ?azithromycin (ZITHROMAX) 250 MG tablet Take 1 tablet (250 mg total) by mouth daily. Take first 2 tablets together, then 1 every day until finished. 03/30/21   Theron AristaSage, Haley, PA-C  ?benzonatate (TESSALON) 100 MG capsule Take 1 capsule (100 mg total) by mouth every 8 (eight) hours. 03/30/21   Theron AristaSage, Haley, PA-C  ?DULoxetine (CYMBALTA) 20 MG capsule Take 1 capsule by mouth daily. 08/08/20   [provider]  ?hydrOXYzine (ATARAX/VISTARIL) 25 MG tablet Take 25 mg by mouth 2 (two) times daily. 08/16/20   [provider]  ?ibuprofen (ADVIL) 800 MG tablet Take 1 tablet (800 mg total) by mouth every 6 (six) hours as needed. 08/22/20   Candelaria StagersPatel, Kevin P, DPM  ?ipratropium (ATROVENT) 0.06 % nasal spray Place 2 sprays into both nostrils 3 (three) times daily. ?Patient taking differently: Place 2  sprays into both nostrils 3 (three) times daily as needed. 04/27/19   Bing NeighborsHarris, Kimberly S, FNP  ?ketorolac (TORADOL) 10 MG tablet Take 10 mg by mouth every 6 (six) hours as needed. 03/22/20   [provider]  ?levocetirizine (XYZAL) 5 MG tablet Take 1 tablet (5 mg total) by mouth every evening. 04/27/19   Bing NeighborsHarris, Kimberly S, FNP  ?methocarbamol (ROBAXIN) 500 MG tablet Take 500 mg by mouth 3 (three) times daily. 01/01/20   [provider]  ?ondansetron (ZOFRAN ODT) 4 MG disintegrating tablet 4mg  ODT q4 hours prn nausea/vomit 03/03/20   Melene PlanFloyd, Dan, DO  ?phenazopyridine (PYRIDIUM) 200 MG tablet Take 1 tablet (200 mg total) by mouth 3 (three) times daily as needed for pain. 04/07/20   Crist FatHerrick, Benjamin W, MD  ?potassium chloride (KLOR-CON) 10 MEQ tablet Take 10 mEq by mouth 2 (two) times daily.    [provider]  ?predniSONE (DELTASONE) 20 MG tablet Take 2 tablets (40 mg total) by mouth daily with breakfast. 06/15/20   Bing NeighborsHarris, Kimberly S, FNP  ?tamsulosin (FLOMAX) 0.4 MG CAPS capsule Take 1 capsule (0.4 mg total) by mouth daily after supper. Take until the stent is removed. 04/07/20   Crist FatHerrick, Benjamin W, MD  ?traMADol (ULTRAM) 50 MG tablet Take 1-2 tablets (50-100 mg total) by mouth every 6 (six) hours as needed for moderate pain. 04/07/20   Crist FatHerrick, Benjamin W, MD  ?traZODone (DESYREL) 50 MG tablet Take 50 mg by mouth at bedtime. 04/09/19 04/07/20  [provider]  ?  triamcinolone (KENALOG) 0.025 % ointment Apply 1 application topically 2 (two) times daily. 06/15/20   Bing Neighbors, FNP  ?   ? ?Allergies    ?Other, Betadine [povidone iodine], Iodinated contrast media, Shellfish allergy, and Topiramate   ? ?Review of Systems   ?Review of Systems  ?Gastrointestinal:  Positive for abdominal pain.  ?All other systems reviewed and are negative. ? ?Physical Exam ?Updated Vital Signs ?BP 115/77   Pulse 86   Temp 98.3 ?F (36.8 ?C) (Oral)   Resp 16   Ht 5\' 3"  (1.6 m)   Wt 74.8 kg   SpO2  99%   BMI 29.23 kg/m?  ?Physical Exam ?Vitals and nursing note reviewed.  ?Constitutional:   ?   General: She is not in acute distress. ?   Appearance: She is well-developed. She is not diaphoretic.  ?HENT:  ?   Head: Normocephalic and atraumatic.  ?Cardiovascular:  ?   Rate and Rhythm: Normal rate and regular rhythm.  ?   Heart sounds: No murmur heard. ?  No friction rub. No gallop.  ?Pulmonary:  ?   Effort: Pulmonary effort is normal. No respiratory distress.  ?   Breath sounds: Normal breath sounds. No wheezing.  ?Abdominal:  ?   General: Bowel sounds are normal. There is no distension.  ?   Palpations: Abdomen is soft.  ?   Tenderness: There is abdominal tenderness in the left upper quadrant. There is no right CVA tenderness, left CVA tenderness, guarding or rebound.  ?Musculoskeletal:     ?   General: Normal range of motion.  ?   Cervical back: Normal range of motion and neck supple.  ?Skin: ?   General: Skin is warm and dry.  ?Neurological:  ?   General: No focal deficit present.  ?   Mental Status: She is alert and oriented to person, place, and time.  ? ? ?ED Results / Procedures / Treatments   ?Labs ?(all labs ordered are listed, but only abnormal results are displayed) ?Labs Reviewed  ?LIPASE, BLOOD - Abnormal; Notable for the following components:  ?    Result Value  ? Lipase <10 (*)   ? All other components within normal limits  ?COMPREHENSIVE METABOLIC PANEL - Abnormal; Notable for the following components:  ? Potassium 3.4 (*)   ? Glucose, Bld 101 (*)   ? All other components within normal limits  ?URINALYSIS, ROUTINE W REFLEX MICROSCOPIC - Abnormal; Notable for the following components:  ? Leukocytes,Ua TRACE (*)   ? All other components within normal limits  ?CBC  ?PREGNANCY, URINE  ? ? ?EKG ?None ? ?Radiology ?No results found. ? ?Procedures ?Procedures  ? ? ?Medications Ordered in ED ?Medications  ?ondansetron (ZOFRAN-ODT) disintegrating tablet 8 mg (has no administration in time range)  ? ? ?ED  Course/ Medical Decision Making/ A&P ? ?This patient presents to the ED for concern of abdominal pain, this involves an extensive number of treatment options, and is a complaint that carries with it a high risk of complications and morbidity.  The differential diagnosis includes pancreatitis, UTI, kidney stone, small bowel obstruction ? ? ?Co morbidities that complicate the patient evaluation ? ?None ? ? ?Additional history obtained: ? ?No additional history or external records needed ? ? ?Lab Tests: ? ?I Ordered, and personally interpreted labs.  The pertinent results include: Unremarkable CBC, comprehensive metabolic panel, and lipase.  Urine shows only trace leukocytes. ? ? ?Imaging Studies ordered: ? ?I ordered imaging studies including CT  of the abdomen and pelvis ?I independently visualized and interpreted imaging which showed no acute intra-abdominal process ?I agree with the radiologist interpretation ? ? ?Cardiac Monitoring: / EKG: ? ?None performed ? ? ?Consultations Obtained: ? ?No consultations needed ? ? ?Problem List / ED Course / Critical interventions / Medication management ? ?Patient presenting with left-sided abdominal pain, the etiology of which I am uncertain.  Her laboratory studies are reassuring and CT scan of the abdomen shows no acute intra-abdominal process.  Patient declining pain medication, but was given Zofran for nausea.  At this point, I feel as though I have ruled out emergent pathology and feel as though she can safely be discharged.  She has Zofran at home which she will take for nausea and Tylenol/Motrin for pain.  To return as needed if symptoms worsen or change. ?I have reviewed the patients home medicines and have made adjustments as needed ? ? ?Social Determinants of Health: ? ?None ? ? ?Test / Admission - Considered: ? ?Patient to be discharged to home with medication for pain and nausea.  She is to return as needed if symptoms worsen.  No indications for admission or  surgical intervention. ? ? ?Final Clinical Impression(s) / ED Diagnoses ?Final diagnoses:  ?None  ? ? ?Rx / DC Orders ?ED Discharge Orders   ? ? None  ? ?  ? ? ?  ?Geoffery Lyons, MD ?09/06/21 [phone] ? ?

## 2021-12-05 ENCOUNTER — Ambulatory Visit: Payer: Managed Care, Other (non HMO) | Admitting: Podiatry

## 2022-03-09 ENCOUNTER — Other Ambulatory Visit: Payer: Self-pay | Admitting: Family Medicine

## 2022-03-09 ENCOUNTER — Ambulatory Visit
Admission: RE | Admit: 2022-03-09 | Discharge: 2022-03-09 | Disposition: A | Discharge status: Home / Self Care | Payer: 59 | Source: Ambulatory Visit | Attending: Family Medicine | Admitting: Family Medicine

## 2022-03-09 DIAGNOSIS — R9389 Abnormal findings on diagnostic imaging of other specified body structures: Secondary | ICD-10-CM

## 2022-04-10 ENCOUNTER — Encounter (HOSPITAL_BASED_OUTPATIENT_CLINIC_OR_DEPARTMENT_OTHER): Payer: Self-pay

## 2022-04-10 ENCOUNTER — Other Ambulatory Visit: Payer: Self-pay

## 2022-04-10 ENCOUNTER — Emergency Department (HOSPITAL_BASED_OUTPATIENT_CLINIC_OR_DEPARTMENT_OTHER): Payer: 59 | Admitting: Radiology

## 2022-04-10 ENCOUNTER — Emergency Department (HOSPITAL_BASED_OUTPATIENT_CLINIC_OR_DEPARTMENT_OTHER)
Admission: EM | Admit: 2022-04-10 | Discharge: 2022-04-11 | Discharge status: Against Medical Advice | Payer: 59 | Attending: Emergency Medicine | Admitting: Emergency Medicine

## 2022-04-10 DIAGNOSIS — Z5321 Procedure and treatment not carried out due to patient leaving prior to being seen by health care provider: Secondary | ICD-10-CM | POA: Diagnosis not present

## 2022-04-10 DIAGNOSIS — R0781 Pleurodynia: Secondary | ICD-10-CM | POA: Insufficient documentation

## 2022-04-10 NOTE — ED Triage Notes (Signed)
Pt states that she had a "violent" sneeze approx 2 hours ago and had sudden onset of sharp pain in right rib area. Twisting and bending causes increase in pain. Denies SOB/CP.

## 2022-04-11 MED ORDER — IBUPROFEN 800 MG PO TABS
800.0000 mg | ORAL_TABLET | Freq: Once | ORAL | Status: DC
  Filled 2022-04-11: qty 1

## 2022-04-11 NOTE — ED Notes (Signed)
Pt reassessed and vitals updated. Pt's pain addressed with medication order, but pt states that she is going to leave and go home and follow up with her PCP tomorrow and will take Motrin at home. Pt encouraged to stay, but she states that she will follow up with her doctor and wishes to leave. Pt advised to come back to ED if condition worsens.

## 2022-04-30 ENCOUNTER — Ambulatory Visit (INDEPENDENT_AMBULATORY_CARE_PROVIDER_SITE_OTHER): Payer: 59 | Admitting: Pulmonary Disease

## 2022-04-30 ENCOUNTER — Encounter: Payer: Self-pay | Admitting: Pulmonary Disease

## 2022-04-30 VITALS — BP 132/82 | HR 89 | Temp 97.8°F | Ht 63.0 in | Wt 182.2 lb

## 2022-04-30 DIAGNOSIS — J984 Other disorders of lung: Secondary | ICD-10-CM

## 2022-04-30 DIAGNOSIS — T451X5A Adverse effect of antineoplastic and immunosuppressive drugs, initial encounter: Secondary | ICD-10-CM

## 2022-04-30 DIAGNOSIS — R053 Chronic cough: Secondary | ICD-10-CM

## 2022-04-30 MED ORDER — BUDESONIDE-FORMOTEROL FUMARATE 160-4.5 MCG/ACT IN AERO: 2.0000 | INHALATION_SPRAY | Freq: Two times a day (BID) | RESPIRATORY_TRACT | 12 refills | Status: DC

## 2022-04-30 MED ORDER — PREDNISONE 10 MG PO TABS: ORAL_TABLET | ORAL | 0 refills | Status: AC

## 2022-04-30 NOTE — Patient Instructions (Signed)
Nice to meet you  Based on the history of cough for some at night, the multiple bouts of bronchitis in the past, and the slight change on chest x-ray in 2022, I am highly suspicious for asthma.  Take prednisone 20 mg a day for 5 days then 10 mg for 5 days then stop  I prescribed a new inhaler, Symbicort.  2 puffs twice a day every day.  If this is too expensive let me know.  Rinse your mouth out with water after every use.  To evaluate methotrexate contributing, I ordered a CT scan of the chest, high-resolution CT scan.  My suspicion is overall low but I think additional investigation is warranted.  Return to clinic in 2 months or sooner as needed with Dr. Silas Flood

## 2022-04-30 NOTE — Progress Notes (Signed)
@Patient  ID: Beverly Ford, female    DOB: April 08, 1976, 47 y.o.   MRN: [id number]  Chief Complaint  Patient presents with   Consult    Cough started 11/16     Referring provider: Glenis Smoker, *  HPI:   47 y.o. whom we are seeing for evaluation of cough.  Recent PCP note reviewed.  Recent ED note reviewed.  She was in usual health.  Developed cough 03/08/2022.  She denies any preceding illness.  No viral symptoms etc.  Just cough.  Has persisted since.  Worse in the evenings.  No position to make things better or worse.  No seasonal or environmental factors she can identify to make things better or worse.  She took prednisone for few days prescribed by her PCP.  Seems like cough improved with this but only for a few days and then subsequent is returned.  No other relieving or exacerbating factors.  She had chest x-ray 03/2021 personally reviewed and interpreted as clear lungs bilaterally with mild hyperinflation on the lateral view.  Most recent chest x-ray 03/2022 personally reviewed and interpreted as clear lungs and unchanged from prior, no lateral to view.  She reports a history of recurrent bronchitis.  At least once a year in the December/January timeframe.  For several years.  No prior history of asthma but has been brought up in the past due to this.  She does have albuterol but does not feel like it helps much.  She denies any significant shortness of breath or dyspnea on exertion..   Questionaires / Pulmonary Flowsheets:   ACT:      No data to display          MMRC:     No data to display          Epworth:      No data to display          Tests:   FENO:  No results found for: "NITRICOXIDE"  PFT:     No data to display          WALK:      No data to display          Imaging: Personally reviewed and as per EMR discussion this note DG Ribs Unilateral W/Chest Right  Result Date: 04/10/2022 CLINICAL DATA:  Rib pain after sneezing  EXAM: RIGHT RIBS AND CHEST - 3+ VIEW COMPARISON:  03/30/2021 FINDINGS: No fracture or other bone lesions are seen involving the ribs. There is no evidence of pneumothorax or pleural effusion. Both lungs are clear. Heart size and mediastinal contours are within normal limits. IMPRESSION: Negative. Electronically Signed   By: Donavan Foil M.D.   On: 04/10/2022 23:20    Lab Results: Personally reviewed CBC    Component Value Date/Time   WBC 10.5 09/05/2021 2007   RBC 4.83 09/05/2021 2007   HGB 13.2 09/05/2021 2007   HCT 40.3 09/05/2021 2007   PLT 297 09/05/2021 2007   MCV 83.4 09/05/2021 2007   MCH 27.3 09/05/2021 2007   MCHC 32.8 09/05/2021 2007   RDW 13.9 09/05/2021 2007   LYMPHSABS 1.0 03/30/2021 1732   MONOABS 0.5 03/30/2021 1732   EOSABS 0.1 03/30/2021 1732   BASOSABS 0.0 03/30/2021 1732    BMET    Component Value Date/Time   NA 137 09/05/2021 2007   K 3.4 (L) 09/05/2021 2007   CL 100 09/05/2021 2007   CO2 26 09/05/2021 2007   GLUCOSE 101 (H) 09/05/2021 2007  BUN 11 09/05/2021 2007   CREATININE 0.85 09/05/2021 2007   CALCIUM 9.1 09/05/2021 2007   GFRNONAA >60 09/05/2021 2007    BNP No results found for: "BNP"  ProBNP No results found for: "PROBNP"  Specialty Problems       Pulmonary Problems   OSA (obstructive sleep apnea)    Last Assessment & Plan:  Formatting of this note might be different from the original. Suspected.  She reports EDS, snoring, fatigue, morning headaches and witnessed apneas.  No previous cardiac or pulmonary diseases.  She has never smoked.  No parasomnias.  Will schedule her for a sleep study  Therapeutic lifestyle changes were discussed. Avoid supine position.  Avoid alcohol within 4 hours of sleep, sedatives, hypnotics and any other medications that could make you sleepy. Avoid driving or operating heavy machinery if excessively drowsy. Assure 7-8 h sleep nightly to maximize the effectiveness. Formatting of this note might be different  from the original. Last Assessment & Plan:  Suspected.  She reports EDS, snoring, fatigue, morning headaches and witnessed apneas.  No previous cardiac or pulmonary diseases.  She has never smoked.  No parasomnias.  Will schedule her for a sleep study  Therapeutic lifestyle changes were discussed. Avoid supine position.  Avoid alcohol within 4 hours of sleep, sedatives, hypnotics and any other medications that could make you sleepy. Avoid driving or operating heavy machinery if excessively drowsy. Assure 7-8 h sleep nightly to maximize the effectiveness. Formatting of this note might be different from the original. Formatting of this note might be different from the original. Last Assessment & Plan:  Suspected.  She reports EDS, snoring, fatigue, morning headaches and witnessed apneas.  No previous cardiac or pulmonary diseases.  She has never smoked.  No parasomnias.  Will schedule her for a sleep study  Therapeutic lifestyle changes were discussed. Avoid supine position.  Avoid alcohol within 4 hours of sleep, sedatives, hypnotics and any other medications that could make you sleepy. Avoid driving or operating heavy machinery if excessively drowsy. Assure 7-8 h sleep nightly to maximize the effectiveness. Last Assessment & Plan:  Formatting of this note might be different from the original. Suspected.  She reports EDS, snoring, fatigue, morning headaches and witnessed apneas.  No previous cardiac or pulmonary diseases.  She has never smoked.  No parasomnias.  Will schedule her for a sleep study  Therapeutic lifestyle changes were discussed. Avoid supine position.  Avoid alcohol within 4 hours of sleep, sedatives, hypnotics and any other medications that could make you sleepy. Avoid driving or operating heavy machinery if excessively drowsy. Assure 7-8 h sleep nightly to maximize the effectiveness.       Allergies  Allergen Reactions   Other     Other reaction(s): Other IVP dye  Unknown  reaction IVP dye  Unknown reaction    Betadine [Povidone Iodine] Hives    blisters   Iodinated Contrast Media     Other reaction(s): Other (See Comments) IVP dye  Unknown reaction   Shellfish Allergy Hives    All shellfish   Topiramate     Other reaction(s): Other kidney stones kidney stones     Immunization History  Administered Date(s) Administered   DTaP 01/02/1975, 01/31/1975, 05/11/1975, 07/31/1980   Hepatitis A, Ped/Adol-2 Dose 05/20/2008, 11/09/2008   Hepatitis B, PED/ADOLESCENT 05/20/2008, 07/07/2008, 11/09/2008   IPV 01/02/1975, 03/03/1975, 05/11/1975, 07/15/1976   Influenza Split 01/24/2017   Influenza,inj,Quad PF,6-35 Mos 02/11/2018   Influenza,inj,quad, With Preservative 01/24/2017   MMR 02/25/1976, 01/17/1989  PFIZER Comirnaty(Gray Top)Covid-19 Tri-Sucrose Vaccine 07/14/2019, 08/04/2019   PFIZER(Purple Top)SARS-COV-2 Vaccination 07/14/2019, 08/04/2019   PPD Test 01/25/2017   Td 04/23/2013   Tdap 05/20/2008    Past Medical History:  Diagnosis Date   GAD (generalized anxiety disorder)    History of diverticulitis of colon    History of DVT of lower extremity 11/2015   per pt post op left bunion LLE dvt and completed blood thinner   History of kidney stones    Left ureteral stone    MDD (major depressive disorder)    Mild obstructive sleep apnea    per study in epic 11-08-2015,  per pt no cpap recommendation   Nephrolithiasis    bilateral nonobstructive    Thoracic outlet syndrome    [facility] -- intermittant symptoms, but no spine related and been referred for second opinion    Tobacco History: Social History   Tobacco Use  Smoking Status Never  Smokeless Tobacco Never   Counseling given: Not Answered   Continue to not smoke  Outpatient Encounter Medications as of 04/30/2022  Medication Sig   albuterol (VENTOLIN HFA) 108 (90 Base) MCG/ACT inhaler Inhale into the lungs.   budesonide-formoterol (SYMBICORT) 160-4.5  MCG/ACT inhaler Inhale 2 puffs into the lungs 2 (two) times daily.   chlorthalidone (HYGROTON) 25 MG tablet    DULoxetine (CYMBALTA) 20 MG capsule Take 1 capsule by mouth daily.   hydrOXYzine (ATARAX/VISTARIL) 25 MG tablet Take 25 mg by mouth 2 (two) times daily.   ibuprofen (ADVIL) 800 MG tablet Take 1 tablet (800 mg total) by mouth every 6 (six) hours as needed.   ipratropium (ATROVENT) 0.06 % nasal spray Place 2 sprays into both nostrils 3 (three) times daily. (Patient taking differently: Place 2 sprays into both nostrils 3 (three) times daily as needed.)   potassium chloride (KLOR-CON) 10 MEQ tablet Take 10 mEq by mouth 2 (two) times daily.   predniSONE (DELTASONE) 10 MG tablet Take 2 tablets (20 mg total) by mouth daily with breakfast for 5 days, THEN 1 tablet (10 mg total) daily with breakfast for 5 days.   azithromycin (ZITHROMAX) 250 MG tablet Take 1 tablet (250 mg total) by mouth daily. Take first 2 tablets together, then 1 every day until finished.   benzonatate (TESSALON) 100 MG capsule Take 1 capsule (100 mg total) by mouth every 8 (eight) hours.   ketorolac (TORADOL) 10 MG tablet Take 10 mg by mouth every 6 (six) hours as needed.   levocetirizine (XYZAL) 5 MG tablet Take 1 tablet (5 mg total) by mouth every evening.   methocarbamol (ROBAXIN) 500 MG tablet Take 500 mg by mouth 3 (three) times daily.   ondansetron (ZOFRAN ODT) 4 MG disintegrating tablet 4mg  ODT q4 hours prn nausea/vomit   phenazopyridine (PYRIDIUM) 200 MG tablet Take 1 tablet (200 mg total) by mouth 3 (three) times daily as needed for pain.   tamsulosin (FLOMAX) 0.4 MG CAPS capsule Take 1 capsule (0.4 mg total) by mouth daily after supper. Take until the stent is removed.   traMADol (ULTRAM) 50 MG tablet Take 1-2 tablets (50-100 mg total) by mouth every 6 (six) hours as needed for moderate pain.   traZODone (DESYREL) 50 MG tablet Take 50 mg by mouth at bedtime.   triamcinolone (KENALOG) 0.025 % ointment Apply 1  application topically 2 (two) times daily. (Patient not taking: Reported on 04/30/2022)   [DISCONTINUED] predniSONE (DELTASONE) 20 MG tablet Take 2 tablets (40 mg total) by  mouth daily with breakfast.   No facility-administered encounter medications on file as of 04/30/2022.     Review of Systems  Review of Systems  No chest pain with exertion.  No orthopnea or PND.  Comprehensive review systems otherwise negative. Physical Exam  BP 132/82 (BP Location: Left Arm, Cuff Size: Normal)   Pulse 89   Temp 97.8 F (36.6 C) (Oral)   Ht 5\' 3"  (1.6 m)   Wt 182 lb 3.2 oz (82.6 kg)   LMP 04/10/2022 (Exact Date)   SpO2 97%   BMI 32.28 kg/m   Wt Readings from Last 5 Encounters:  04/30/22 182 lb 3.2 oz (82.6 kg)  09/05/21 165 lb (74.8 kg)  03/30/21 168 lb (76.2 kg)  11/17/20 175 lb (79.4 kg)  04/07/20 148 lb 3.2 oz (67.2 kg)    BMI Readings from Last 5 Encounters:  04/30/22 32.28 kg/m  09/05/21 29.23 kg/m  03/30/21 29.76 kg/m  11/17/20 31.00 kg/m  04/07/20 26.25 kg/m     Physical Exam  General: Sitting in chair, no acute distress Eyes: EOMI, icterus Neck: Supple no JVP Pulmonary: Clear, normal work of breathing Cardiovascular: Warm, no edema Abdomen: Nondistended, bowel sounds present MSK: No synovitis, no joint effusion Neuro: Normal gait, no weakness Psych: Normal mood, full affect  Assessment & Plan:   Chronic cough: Present for couple months.  Worse in the evenings.  Recurrent bronchitis in the past.  Mild hyperinflation on lateral chest x-ray.  High suspicion for asthma.  See below.  High-res CT scan for further evaluation, particular with starting methotrexate just prior to onset of symptoms.  Notably, chest x-ray 03/2022 is clear and stable compared to 03/2021 (was not on methotrexate at that time).  Asthma: Clinical diagnosis based on recurrent bronchitis, mild hyperinflation, worsening cough in the evenings.  High-dose ICS LABA via Symbicort, prefer HFA to avoid  DPI possible worsening cough.  Prednisone 20 mg for 5 days, then 10 mg for 5 days prescribed.   Return in about 2 months (around 06/29/2022).   08/29/2022, MD 04/30/2022   This appointment required 61 minutes of patient care (this includes precharting, chart review, review of results, face-to-face care, etc.).

## 2022-05-09 ENCOUNTER — Telehealth: Payer: Self-pay

## 2022-05-09 ENCOUNTER — Other Ambulatory Visit (HOSPITAL_COMMUNITY): Payer: Self-pay

## 2022-05-09 NOTE — Telephone Encounter (Signed)
PA request received via CMM for Budesonide-Formoterol Fumarate 160-4.5MCG/ACT aerosol  PA has been submitted to [facility] and is pending determination.   Key: [id number]

## 2022-05-10 ENCOUNTER — Other Ambulatory Visit (HOSPITAL_COMMUNITY): Payer: Self-pay

## 2022-05-10 NOTE — Telephone Encounter (Signed)
Sir please advise on PA for Symbicort being denied:  PA has been DENIED due to:    We have denied your request because you do not meet any of these conditions. We reviewed the information we had. Your request has been denied. Your doctor can send [place] any new or missing information for [place] to review. The preferred drugs for your plan are: fluticasone-salmeterol (except certain NDCs), Wixela Inhub, BREO ELLIPTA (except certain NDCs)

## 2022-05-10 NOTE — Telephone Encounter (Signed)
Can we try fluticasone salmeterol HFA 220 mcg dose 2 puff BID? DPI will make her cough worse and those are the only other options listed.

## 2022-05-10 NOTE — Telephone Encounter (Signed)
PA has been DENIED due to:   We have denied your request because you do not meet any of these conditions. We reviewed the information we had. Your request has been denied. Your doctor can send [place] any new or missing information for [place] to review. The preferred drugs for your plan are: fluticasone-salmeterol (except certain NDCs), Wixela Inhub, BREO ELLIPTA (except certain NDCs)

## 2022-05-11 MED ORDER — FLUTICASONE-SALMETEROL 230-21 MCG/ACT IN AERO: 2.0000 | INHALATION_SPRAY | Freq: Two times a day (BID) | RESPIRATORY_TRACT | 12 refills | Status: DC

## 2022-05-11 NOTE — Telephone Encounter (Signed)
Called patient and informed her that since the PA was denied for the Symbicort we are switching to Advair. I went over instructions of medication and she verbalized understanding. Verified pharmacy. Nothing further needed

## 2022-05-11 NOTE — Addendum Note (Signed)
Addended by: Monna Fam L on: 05/11/2022 09:50 AM   Modules accepted: Orders

## 2022-05-14 ENCOUNTER — Encounter: Payer: Self-pay | Admitting: Pulmonary Disease

## 2022-05-14 DIAGNOSIS — T451X5A Adverse effect of antineoplastic and immunosuppressive drugs, initial encounter: Secondary | ICD-10-CM

## 2022-05-15 NOTE — Telephone Encounter (Signed)
Pharmacy, please advise on pt's message. She states her medication was denied but in a previous message it states the fluticasone-salmeterol were covered alternatives. Could you help with this? Thanks.

## 2022-05-16 ENCOUNTER — Ambulatory Visit (HOSPITAL_BASED_OUTPATIENT_CLINIC_OR_DEPARTMENT_OTHER): Payer: 59

## 2022-05-17 ENCOUNTER — Other Ambulatory Visit (HOSPITAL_COMMUNITY): Payer: Self-pay

## 2022-05-17 NOTE — Telephone Encounter (Signed)
Dr. Silas Flood, please see message below from the pharmacy team. [place] is covered but the advair hfa is not. Is it ok to send in [place]? Thanks.

## 2022-05-17 NOTE — Telephone Encounter (Signed)
In previous messages stated that the Fluticasone-salmeterol/Wixela was the formulation that was covered, the medication called in was the Fluticasone-salmeterol/Advair HFA was sent in to the pharmacy which was not listed as a preferred alternative.

## 2022-05-18 MED ORDER — FLUTICASONE-SALMETEROL 500-50 MCG/ACT IN AEPB: 1.0000 | INHALATION_SPRAY | Freq: Two times a day (BID) | RESPIRATORY_TRACT | 5 refills | Status: DC

## 2022-05-18 NOTE — Telephone Encounter (Signed)
Can try Wixela 500 mcg dose 1 puff BID - had hoped to avoid DPI but no option per insurance

## 2022-05-18 NOTE — Telephone Encounter (Signed)
[place] message sent by pt stating that her CT was cancelled. Routing to [facility] for review.    Please advise on this.

## 2022-05-19 ENCOUNTER — Ambulatory Visit (HOSPITAL_BASED_OUTPATIENT_CLINIC_OR_DEPARTMENT_OTHER): Payer: 59

## 2022-05-22 MED ORDER — FLUTICASONE-SALMETEROL 500-50 MCG/ACT IN AEPB: 1.0000 | INHALATION_SPRAY | Freq: Two times a day (BID) | RESPIRATORY_TRACT | 5 refills | Status: DC

## 2022-05-25 ENCOUNTER — Other Ambulatory Visit: Payer: Self-pay | Admitting: *Deleted

## 2022-05-25 MED ORDER — FLUTICASONE-SALMETEROL 500-50 MCG/ACT IN AEPB: 1.0000 | INHALATION_SPRAY | Freq: Two times a day (BID) | RESPIRATORY_TRACT | 3 refills | Status: DC

## 2022-05-28 NOTE — Telephone Encounter (Signed)
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

## 2022-05-30 NOTE — Addendum Note (Signed)
Addended byLarey Days on: 05/30/2022 12:13 PM   Modules accepted: Orders

## 2022-05-30 NOTE — Telephone Encounter (Signed)
New order of CT hi res placed with single diagnosis of methotrexate lung.

## 2022-06-05 ENCOUNTER — Telehealth: Payer: Self-pay | Admitting: Pulmonary Disease

## 2022-06-05 NOTE — Telephone Encounter (Signed)
Pharmacy call to in the nurse that they did not get the total fax for the medication, Wixela inhaler.  She stated that the fax was cut off and would like it re-faxed.  Fax# [phone], Phone# [phone]

## 2022-06-05 NOTE — Telephone Encounter (Signed)
Will refax [employer] order. Nothing further needed

## 2022-06-21 ENCOUNTER — Telehealth: Payer: Self-pay | Admitting: Pulmonary Disease

## 2022-06-21 NOTE — Telephone Encounter (Signed)
[employer] got a fax where directions were incomplete and all the fax pages did not transmit. See last signed tel encounter for more details.  Please refax.   FAX # : [phone]

## 2022-06-22 NOTE — Telephone Encounter (Signed)
Noted will refax with clear directions. Noting further needed

## 2022-06-27 ENCOUNTER — Ambulatory Visit
Admission: RE | Admit: 2022-06-27 | Discharge: 2022-06-27 | Disposition: A | Discharge status: Home / Self Care | Payer: 59 | Source: Ambulatory Visit | Attending: Pulmonary Disease | Admitting: Pulmonary Disease

## 2022-06-27 DIAGNOSIS — T451X5A Adverse effect of antineoplastic and immunosuppressive drugs, initial encounter: Secondary | ICD-10-CM

## 2022-06-29 NOTE — Progress Notes (Signed)
CT is clear - we can discuss further at [facility] next week

## 2022-07-03 ENCOUNTER — Ambulatory Visit (INDEPENDENT_AMBULATORY_CARE_PROVIDER_SITE_OTHER): Payer: 59 | Admitting: Pulmonary Disease

## 2022-07-03 ENCOUNTER — Encounter: Payer: Self-pay | Admitting: Pulmonary Disease

## 2022-07-03 VITALS — BP 124/66 | HR 88 | Wt 183.6 lb

## 2022-07-03 DIAGNOSIS — R053 Chronic cough: Secondary | ICD-10-CM | POA: Diagnosis not present

## 2022-07-03 DIAGNOSIS — J4540 Moderate persistent asthma, uncomplicated: Secondary | ICD-10-CM | POA: Diagnosis not present

## 2022-07-03 NOTE — Patient Instructions (Addendum)
CT scan was good, no signs of any damage from methotrexate, okay to resume methotrexate or other medicines for your arthritis whenever your rheumatologist would like to  I am glad that [place] has helped with the cough and the shortness of breath.  Lets continue this.  We can discuss decreasing dose at next visit.  Return to clinic in 6 months or sooner if needed with Dr. Silas Flood

## 2022-07-03 NOTE — Progress Notes (Signed)
$'@Patient'S$  ID: Beverly Ford, female    DOB: 05/12/1975, 47 y.o.   MRN: [id number]  Chief Complaint  Patient presents with   Follow-up    Pt is here for follow up for cough. Pt states that the cough is doing much better. She states that the Beverly Ford is doing well with no issues and albuterol as needed     Referring provider: Glenis Smoker, *  HPI:   47 y.o. whom we are seeing for evaluation of cough.    At last visit, high concern for asthma.  After going back to work with insurance, still on [employer].  High-dose.  Cough largely gone.  Shortness of breath improved.  Not totally gone with inclines or stairs but improved.  CT high-resolution obtained after some back-and-forth with insurance.  This demonstrates no signs of methotrexate toxicity, clear lungs, no signs of infiltrate or ILD etc.  Did show some mild air trapping on expiratory.  Further raising suspicion for asthma.  Discussed in detail today.  HPI at initial visit: She was in usual health.  Developed cough 03/08/2022.  She denies any preceding illness.  No viral symptoms etc.  Just cough.  Has persisted since.  Worse in the evenings.  No position to make things better or worse.  No seasonal or environmental factors she can identify to make things better or worse.  She took prednisone for few days prescribed by her PCP.  Seems like cough improved with this but only for a few days and then subsequent is returned.  No other relieving or exacerbating factors.  She had chest x-ray 03/2021 personally reviewed and interpreted as clear lungs bilaterally with mild hyperinflation on the lateral view.  Most recent chest x-ray 03/2022 personally reviewed and interpreted as clear lungs and unchanged from prior, no lateral to view.  She reports a history of recurrent bronchitis.  At least once a year in the December/January timeframe.  For several years.  No prior history of asthma but has been brought up in the past due to this.  She does have  albuterol but does not feel like it helps much.  She denies any significant shortness of breath or dyspnea on exertion..   Questionaires / Pulmonary Flowsheets:   ACT:      No data to display          MMRC:     No data to display          Epworth:      No data to display          Tests:   FENO:  No results found for: "NITRICOXIDE"  PFT:     No data to display          WALK:      No data to display          Imaging: Personally reviewed and as per EMR discussion this note CT Chest High Resolution  Result Date: 06/28/2022 CLINICAL DATA:  Methotrexate toxicity, cough for 4 months EXAM: CT CHEST WITHOUT CONTRAST TECHNIQUE: Multidetector CT imaging of the chest was performed following the standard protocol without intravenous contrast. High resolution imaging of the lungs, as well as inspiratory and expiratory imaging, was performed. RADIATION DOSE REDUCTION: This exam was performed according to the departmental dose-optimization program which includes automated exposure control, adjustment of the mA and/or kV according to patient size and/or use of iterative reconstruction technique. COMPARISON:  None Available. FINDINGS: Cardiovascular: No significant vascular findings. Normal heart size.  No pericardial effusion. Mediastinum/Nodes: No enlarged mediastinal, hilar, or axillary lymph nodes. Thyroid gland, trachea, and esophagus demonstrate no significant findings. Lungs/Pleura: No evidence of fibrotic interstitial lung disease. Mild, lobular air trapping on expiratory phase imaging. No pleural effusion or pneumothorax. Upper Abdomen: No acute abnormality.  Status post cholecystectomy. Musculoskeletal: No chest wall abnormality. No acute osseous findings. IMPRESSION: 1. No evidence of fibrotic interstitial lung disease. 2. Mild, lobular air trapping on expiratory phase imaging, suggestive of small airways disease. Electronically Signed   By: Delanna Ahmadi M.D.   On:  06/28/2022 15:25    Lab Results: Personally reviewed CBC    Component Value Date/Time   WBC 10.5 09/05/2021 2007   RBC 4.83 09/05/2021 2007   HGB 13.2 09/05/2021 2007   HCT 40.3 09/05/2021 2007   PLT 297 09/05/2021 2007   MCV 83.4 09/05/2021 2007   MCH 27.3 09/05/2021 2007   MCHC 32.8 09/05/2021 2007   RDW 13.9 09/05/2021 2007   LYMPHSABS 1.0 03/30/2021 1732   MONOABS 0.5 03/30/2021 1732   EOSABS 0.1 03/30/2021 1732   BASOSABS 0.0 03/30/2021 1732    BMET    Component Value Date/Time   NA 137 09/05/2021 2007   K 3.4 (L) 09/05/2021 2007   CL 100 09/05/2021 2007   CO2 26 09/05/2021 2007   GLUCOSE 101 (H) 09/05/2021 2007   BUN 11 09/05/2021 2007   CREATININE 0.85 09/05/2021 2007   CALCIUM 9.1 09/05/2021 2007   GFRNONAA >60 09/05/2021 2007    BNP No results found for: "BNP"  ProBNP No results found for: "PROBNP"  Specialty Problems       Pulmonary Problems   OSA (obstructive sleep apnea)    Last Assessment & Plan:  Formatting of this note might be different from the original. Suspected.  She reports EDS, snoring, fatigue, morning headaches and witnessed apneas.  No previous cardiac or pulmonary diseases.  She has never smoked.  No parasomnias.  Will schedule her for a sleep study  Therapeutic lifestyle changes were discussed. Avoid supine position.  Avoid alcohol within 4 hours of sleep, sedatives, hypnotics and any other medications that could make you sleepy. Avoid driving or operating heavy machinery if excessively drowsy. Assure 7-8 h sleep nightly to maximize the effectiveness. Formatting of this note might be different from the original. Last Assessment & Plan:  Suspected.  She reports EDS, snoring, fatigue, morning headaches and witnessed apneas.  No previous cardiac or pulmonary diseases.  She has never smoked.  No parasomnias.  Will schedule her for a sleep study  Therapeutic lifestyle changes were discussed. Avoid supine position.  Avoid alcohol within 4  hours of sleep, sedatives, hypnotics and any other medications that could make you sleepy. Avoid driving or operating heavy machinery if excessively drowsy. Assure 7-8 h sleep nightly to maximize the effectiveness. Formatting of this note might be different from the original. Formatting of this note might be different from the original. Last Assessment & Plan:  Suspected.  She reports EDS, snoring, fatigue, morning headaches and witnessed apneas.  No previous cardiac or pulmonary diseases.  She has never smoked.  No parasomnias.  Will schedule her for a sleep study  Therapeutic lifestyle changes were discussed. Avoid supine position.  Avoid alcohol within 4 hours of sleep, sedatives, hypnotics and any other medications that could make you sleepy. Avoid driving or operating heavy machinery if excessively drowsy. Assure 7-8 h sleep nightly to maximize the effectiveness. Last Assessment & Plan:  Formatting of this note  might be different from the original. Suspected.  She reports EDS, snoring, fatigue, morning headaches and witnessed apneas.  No previous cardiac or pulmonary diseases.  She has never smoked.  No parasomnias.  Will schedule her for a sleep study  Therapeutic lifestyle changes were discussed. Avoid supine position.  Avoid alcohol within 4 hours of sleep, sedatives, hypnotics and any other medications that could make you sleepy. Avoid driving or operating heavy machinery if excessively drowsy. Assure 7-8 h sleep nightly to maximize the effectiveness.       Allergies  Allergen Reactions   Other     Other reaction(s): Other IVP dye  Unknown reaction IVP dye  Unknown reaction    Betadine [Povidone Iodine] Hives    blisters   Iodinated Contrast Media     Other reaction(s): Other (See Comments) IVP dye  Unknown reaction   Shellfish Allergy Hives    All shellfish   Topiramate     Other reaction(s): Other kidney stones kidney stones     Immunization History  Administered  Date(s) Administered   DTaP 01/02/1975, 01/31/1975, 05/11/1975, 07/31/1980   Hepatitis A, Ped/Adol-2 Dose 05/20/2008, 11/09/2008   Hepatitis B, PED/ADOLESCENT 05/20/2008, 07/07/2008, 11/09/2008   IPV 01/02/1975, 03/03/1975, 05/11/1975, 07/15/1976   Influenza Split 01/24/2017   Influenza,inj,Quad PF,6-35 Mos 02/11/2018   Influenza,inj,quad, With Preservative 01/24/2017   MMR 02/25/1976, 01/17/1989   PFIZER Comirnaty(Gray Top)Covid-19 Tri-Sucrose Vaccine 07/14/2019, 08/04/2019   PFIZER(Purple Top)SARS-COV-2 Vaccination 07/14/2019, 08/04/2019   PPD Test 01/25/2017   Td 04/23/2013   Tdap 05/20/2008    Past Medical History:  Diagnosis Date   GAD (generalized anxiety disorder)    History of diverticulitis of colon    History of DVT of lower extremity 11/2015   per pt post op left bunion LLE dvt and completed blood thinner   History of kidney stones    Left ureteral stone    MDD (major depressive disorder)    Mild obstructive sleep apnea    per study in epic 11-08-2015,  per pt no cpap recommendation   Nephrolithiasis    bilateral nonobstructive    Thoracic outlet syndrome    followed [facility] -- intermittant symptoms, but no spine related and been referred for second opinion    Tobacco History: Social History   Tobacco Use  Smoking Status Never  Smokeless Tobacco Never   Counseling given: Not Answered   Continue to not smoke  Outpatient Encounter Medications as of 07/03/2022  Medication Sig   albuterol (VENTOLIN HFA) 108 (90 Base) MCG/ACT inhaler Inhale into the lungs.   chlorthalidone (HYGROTON) 25 MG tablet    DULoxetine (CYMBALTA) 20 MG capsule Take 1 capsule by mouth daily.   fluticasone-salmeterol (WIXELA INHUB) 500-50 MCG/ACT AEPB Inhale 1 puff into the lungs in the morning and at bedtime.   hydrOXYzine (ATARAX/VISTARIL) 25 MG tablet Take 25 mg by mouth 2 (two) times daily.   ibuprofen (ADVIL) 800 MG tablet Take 1 tablet (800 mg total) by mouth  every 6 (six) hours as needed.   ipratropium (ATROVENT) 0.06 % nasal spray Place 2 sprays into both nostrils 3 (three) times daily. (Patient taking differently: Place 2 sprays into both nostrils 3 (three) times daily as needed.)   potassium chloride (KLOR-CON) 10 MEQ tablet Take 10 mEq by mouth 2 (two) times daily.   triamcinolone (KENALOG) 0.025 % ointment Apply 1 application topically 2 (two) times daily.   traZODone (DESYREL) 50 MG tablet Take 50 mg by mouth at bedtime.  No facility-administered encounter medications on file as of 07/03/2022.     Review of Systems  Review of Systems  N/a Physical Exam  BP 124/66 (BP Location: Left Arm, Patient Position: Sitting, Cuff Size: Normal)   Pulse 88   Wt 183 lb 9.6 oz (83.3 kg)   SpO2 98%   BMI 32.52 kg/m   Wt Readings from Last 5 Encounters:  07/03/22 183 lb 9.6 oz (83.3 kg)  04/30/22 182 lb 3.2 oz (82.6 kg)  09/05/21 165 lb (74.8 kg)  03/30/21 168 lb (76.2 kg)  11/17/20 175 lb (79.4 kg)    BMI Readings from Last 5 Encounters:  07/03/22 32.52 kg/m  04/30/22 32.28 kg/m  09/05/21 29.23 kg/m  03/30/21 29.76 kg/m  11/17/20 31.00 kg/m     Physical Exam  General: Sitting in chair, no acute distress Eyes: EOMI, icterus Neck: Supple no JVP Pulmonary: Clear, normal work of breathing Cardiovascular: Warm, no edema Abdomen: Nondistended, bowel sounds present MSK: No synovitis, no joint effusion Neuro: Normal gait, no weakness Psych: Normal mood, full affect  Assessment & Plan:   Chronic cough: Present for months in 2023.  Worse in the evenings.  Recurrent bronchitis in the past.  Mild hyperinflation on lateral chest x-ray.  High suspicion for asthma.   High-res CT scan 06/2022 without signs of infiltrate, ILD etc., low suspicion for methotrexate toxicity.  Largely improved with ICS/LABA therapy.  Asthma: Clinical diagnosis based on recurrent bronchitis, mild hyperinflation, worsening cough in the evenings, air trapping on  CT scan.  High-dose Wixela, improvement in dyspnea as well as cough.  Continue for now.  Consider stepdown therapy at next visit.   Return in about 6 months (around 01/03/2023).   Lanier Clam, MD 07/03/2022

## 2022-08-01 ENCOUNTER — Other Ambulatory Visit: Payer: Self-pay | Admitting: Family Medicine

## 2022-08-01 DIAGNOSIS — M8580 Other specified disorders of bone density and structure, unspecified site: Secondary | ICD-10-CM

## 2022-08-08 IMAGING — CT CT ABD-PELV W/ CM
2 of 5 series · 15 of 46 positions shown, 17 images · IV contrast (Omnipaque)
Comparison: None.

CLINICAL DATA: Acute onset of LEFT LOWER QUADRANT abdominal pain
that began this morning, associated with nausea and diarrhea.

EXAM:
CT ABDOMEN AND PELVIS WITH CONTRAST
TECHNIQUE: Multidetector CT imaging of the abdomen and pelvis was performed
using the standard protocol following bolus administration of
intravenous contrast.
CONTRAST:  100mL OMNIPAQUE IOHEXOL 300 MG/ML IV.

[Series 2: axial st · axial · 0.67mm/px · z∈[-745,-360]mm · 12 of 89 slices shown, 14 images]
[im 6/89  soft-tissue]
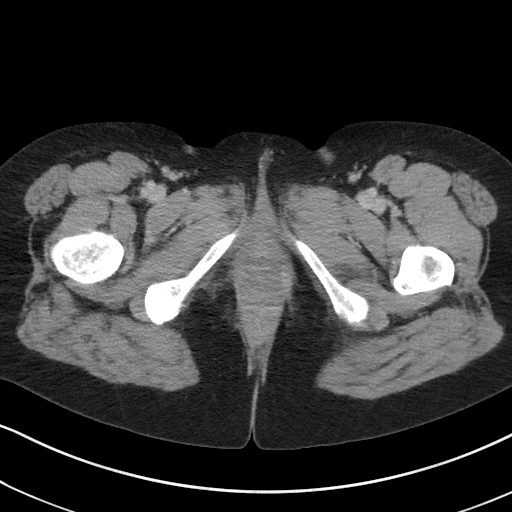
[im 6/89  bone]
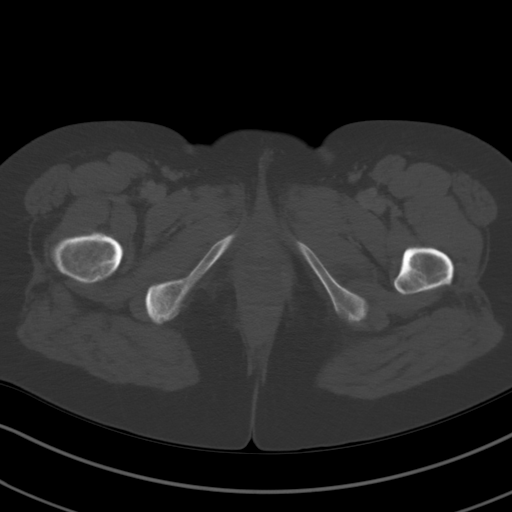
[im 16/89  soft-tissue]
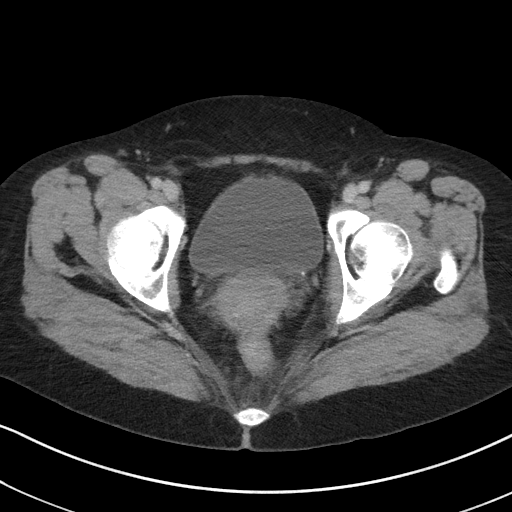
[im 21/89  soft-tissue]
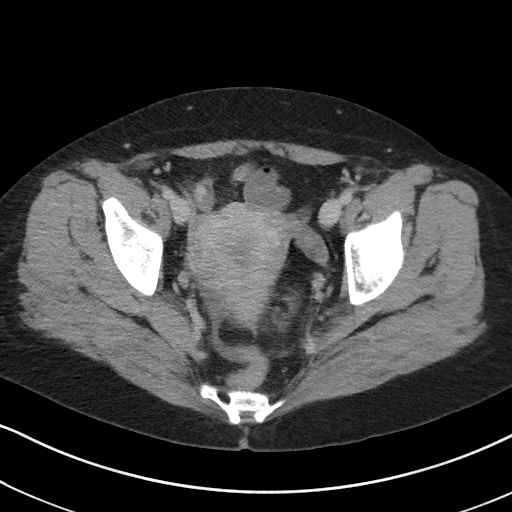
[im 26/89  soft-tissue]
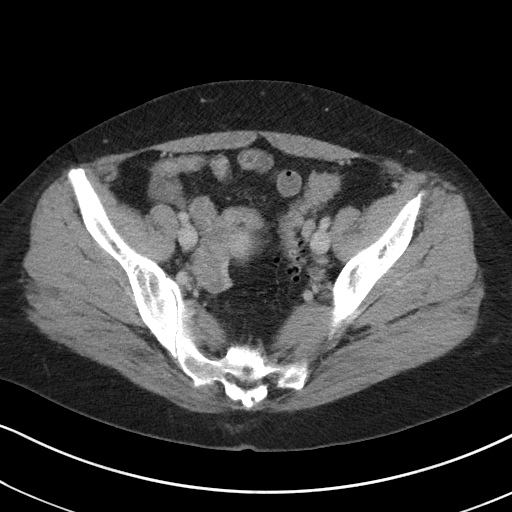
[im 37/89  soft-tissue]
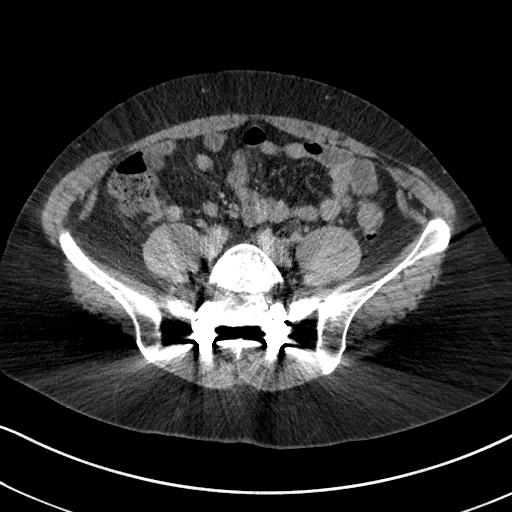
[im 42/89  soft-tissue]
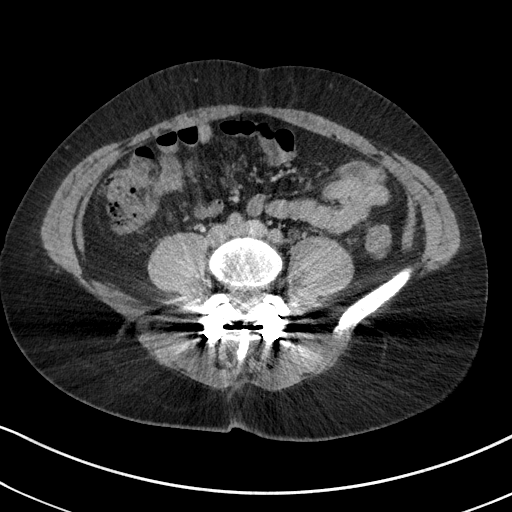
[im 47/89  soft-tissue]
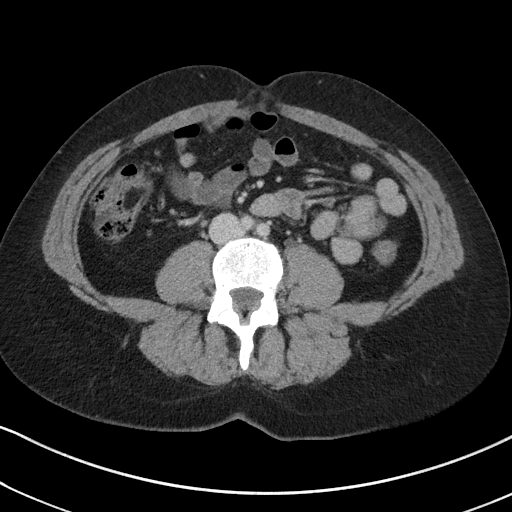
[im 57/89  soft-tissue]
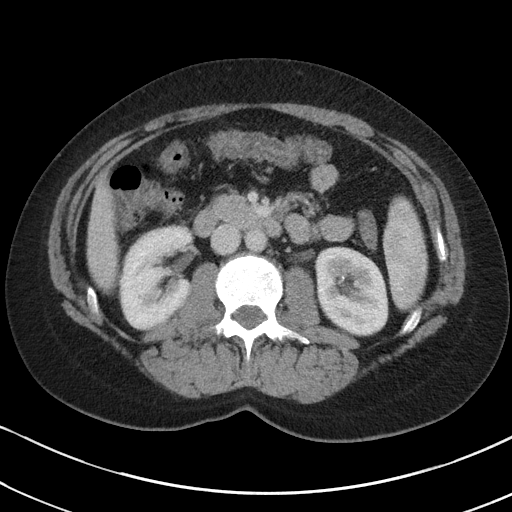
[im 63/89  soft-tissue]
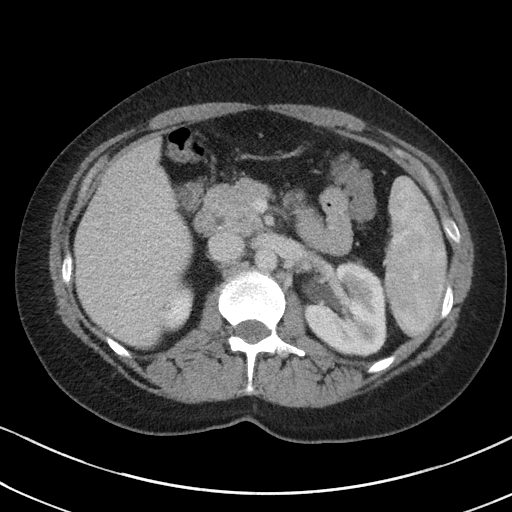
[im 63/89  bone]
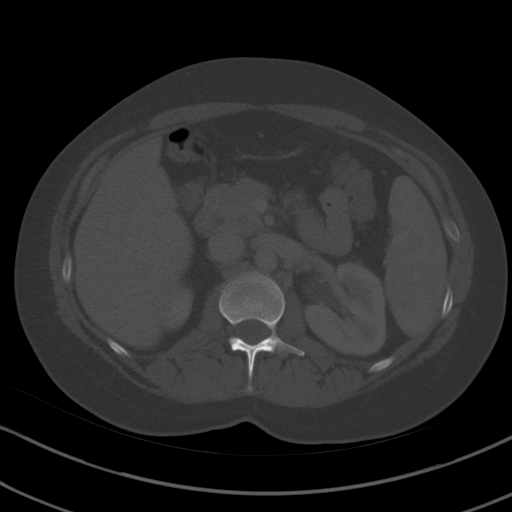
[im 68/89  soft-tissue]
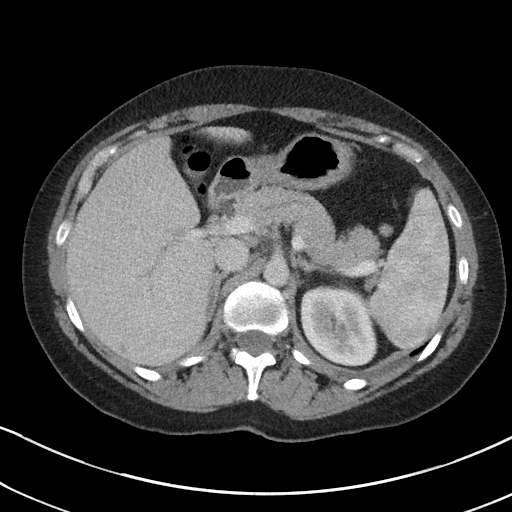
[im 78/89  soft-tissue]
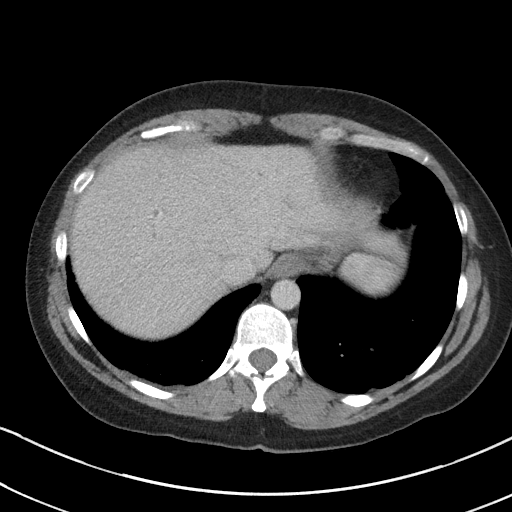
[im 83/89  soft-tissue]
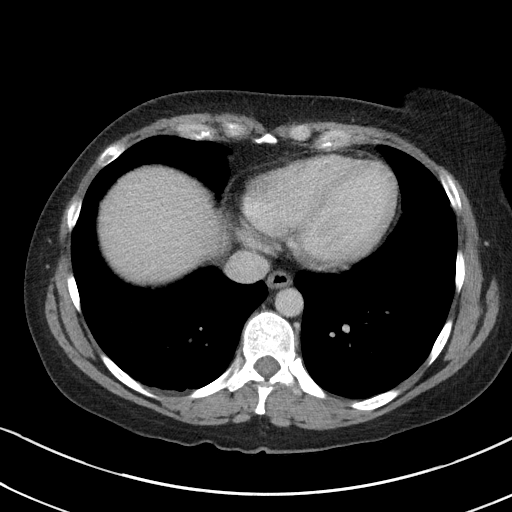

[Series 5: coronal st · coronal · 0.66mm/px · 3 of 101 slices shown]
[im 34/101  soft-tissue]
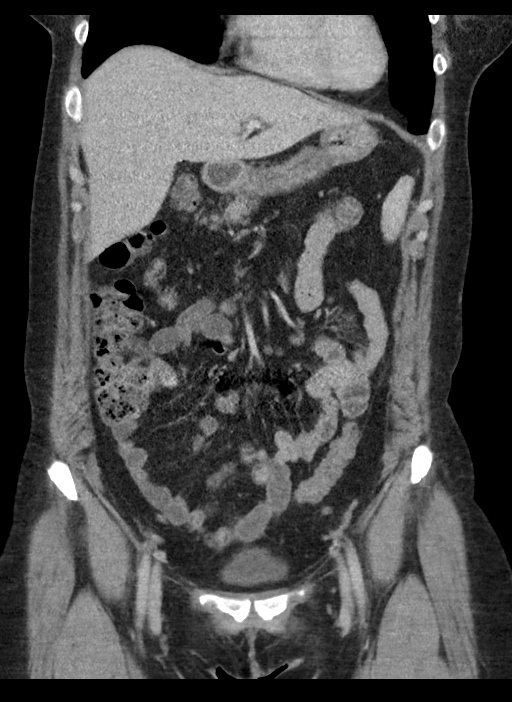
[im 45/101  soft-tissue]
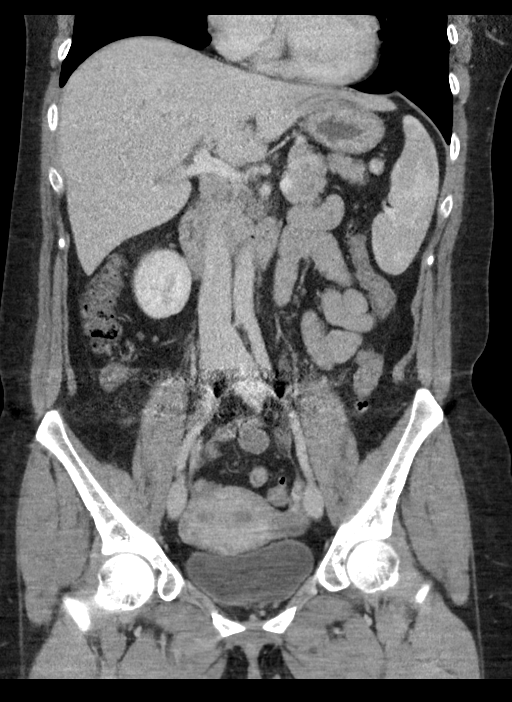
[im 56/101  soft-tissue]
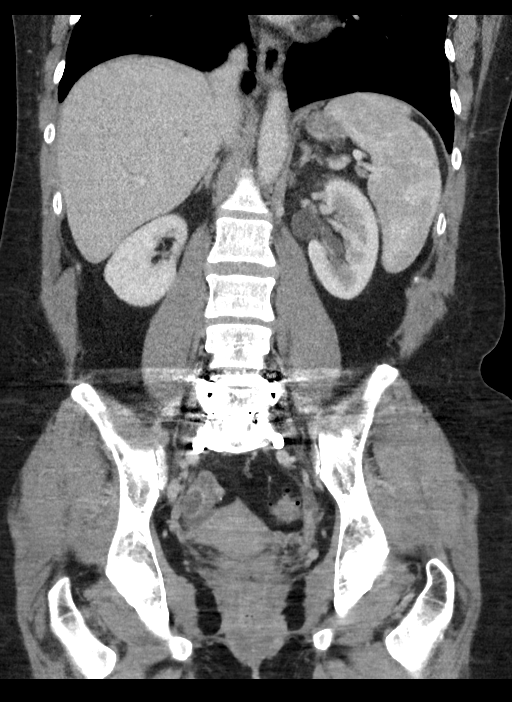

[15 of 46 positions shown; findings below may reference images not displayed]

FINDINGS: Lower chest: Visualized lung bases clear apart from the expected
mild dependent atelectasis posteriorly. Heart size normal.

Hepatobiliary: 6 mm benign cyst in the posterior segment RIGHT lobe
of liver. No significant focal hepatic parenchymal abnormalities.
Surgically absent gallbladder. No unexpected biliary ductal
dilation.

Pancreas: Normal in appearance without evidence of mass, ductal
dilation, or inflammation.

Spleen: Normal in size and appearance. Heterogeneous enhancement on
the initial series is due to the early phase of enhancement, as the
spleen is homogeneous on the delayed images.

Adrenals/Urinary Tract: Normal appearing adrenal glands. Mildly
obstructing 4-5 mm calculus at the LEFT ureterovesical junction
causing mild LEFT hydronephrosis but no significant delay in
contrast excretion. Non-obstructing very small (2-3 mm) calculi in
mid and lower pole calices of both kidneys. No focal parenchymal
abnormality involving either kidney. Normal appearing urinary
bladder.

Stomach/Bowel: Stomach normal in appearance for the degree of
distention. Normal-appearing small bowel. Entire colon relatively
decompressed. Distal descending and sigmoid colon diverticulosis
without evidence of acute diverticulitis. Mild wall thickening with
edema involving the transverse colon. Remainder of the colon normal
in appearance. Normal appendix in the RIGHT upper pelvis.

Vascular/Lymphatic: No visible aortoiliofemoral atherosclerosis.
Widely patent visceral arteries. Normal-appearing portal venous and
systemic venous systems.

No pathologic lymphadenopathy.

Reproductive: Normal-appearing uterus and ovaries without evidence
of a pathologic adnexal mass.

Other: Small amount of free fluid in the dependent portion of the
pelvis (in the cul-de-sac).

Musculoskeletal: Prior L5-S1 fusion without complicating features.
No acute findings.
IMPRESSION: 1. Mildly obstructing 4-5 mm calculus at the LEFT ureterovesical
junction.
2. Non-obstructing very small (2-3 mm) calculi in mid and lower pole
calices of both kidneys.
3. Distal descending and sigmoid colon diverticulosis without
evidence of acute diverticulitis.
4. Mild wall thickening with edema involving the transverse colon,
possibly indicating mild colitis in this patient with diarrhea.
5. Small amount of free fluid in the dependent portion of the pelvis
(in the cul-de-sac), likely physiologic.

## 2022-11-11 IMAGING — MR MR FOOT*L* W/O CM
5 series · 40 of 40 positions shown · non-contrast
Comparison: X-ray 04/27/2020

CLINICAL DATA: Chronic left foot pain. Suspected Morton's neuroma.
History of bunionectomy surgery.

EXAM:
MRI OF THE LEFT FOOT WITHOUT CONTRAST
TECHNIQUE: Multiplanar, multisequence MR imaging of the left forefoot was
performed. No intravenous contrast was administered.

[Series 6: T1 · coronal · 3.0mm · 0.38mm/px · 12 of 51 slices shown (1 of 2)]
[im 1/51]
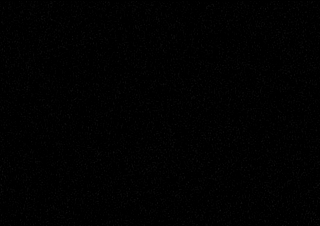
[im 5/51]
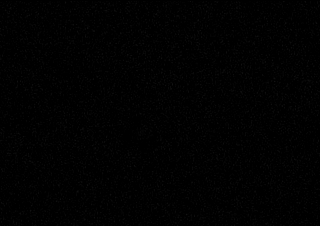
[im 10/51]
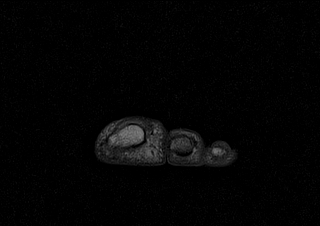
[im 14/51]
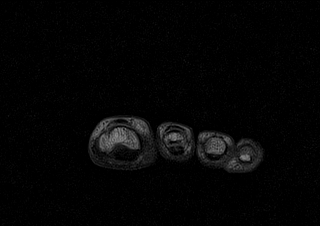
[im 19/51]
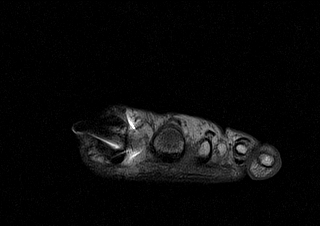
[im 23/51]
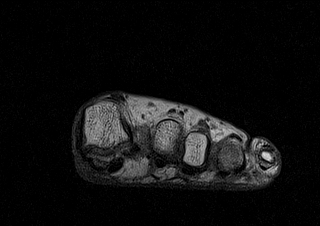
[im 28/51]
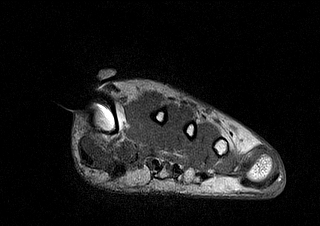
[im 32/51]
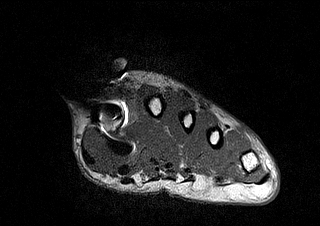
[im 37/51]
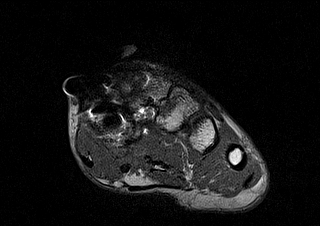
[im 41/51]
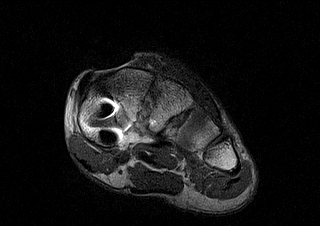
[im 46/51]
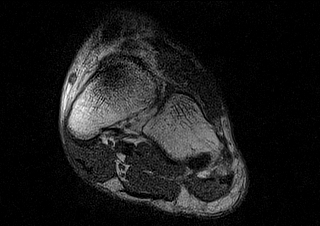
[im 51/51]
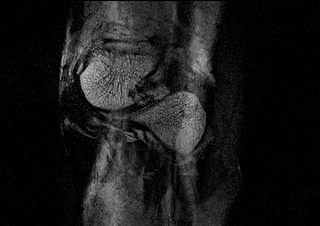

[Series 7: T2 fat-sat · coronal · 3.0mm · 0.38mm/px · 12 of 51 slices shown (1 of 2)]
[im 1/51]
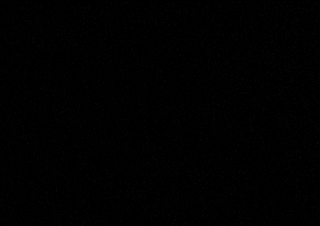
[im 5/51]
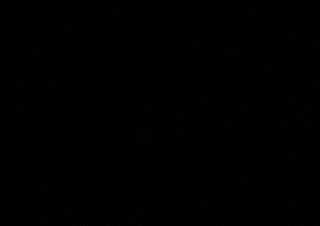
[im 10/51]
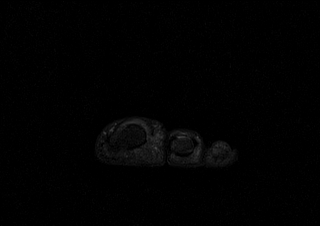
[im 14/51]
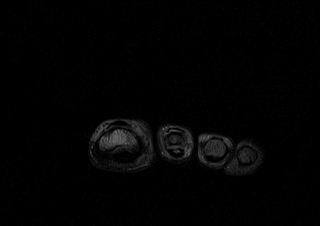
[im 19/51]
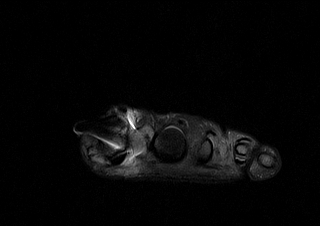
[im 23/51]
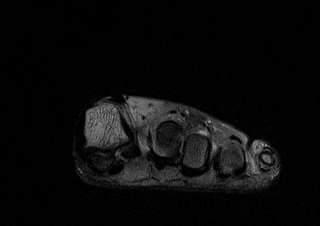
[im 28/51]
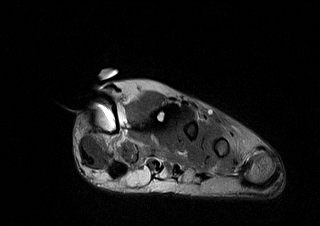
[im 32/51]
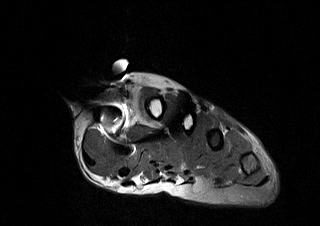
[im 37/51]
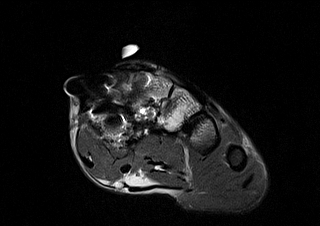
[im 41/51]
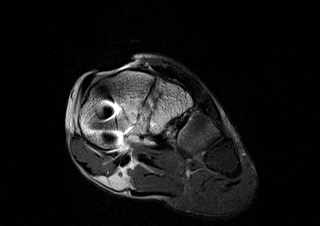
[im 46/51]
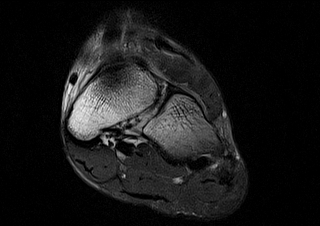
[im 51/51]
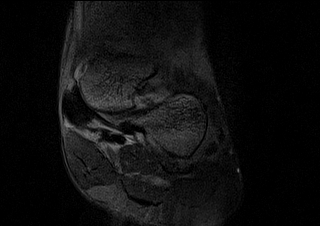

[Series 8: T2 fat-sat · axial · 3.0mm · 0.70mm/px · z∈[-142,-76]mm · 5 of 19 slices shown (2 of 2)]
[im 1/19]
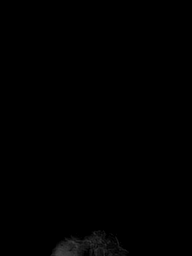
[im 5/19]
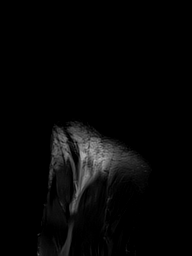
[im 10/19]
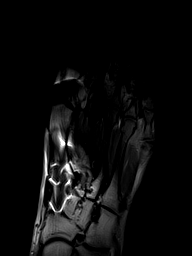
[im 14/19]
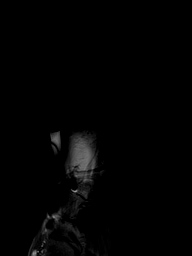
[im 19/19]
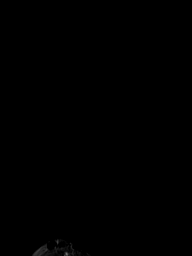

[Series 9: T1 · axial · 3.0mm · 0.56mm/px · z∈[-142,-76]mm · 5 of 19 slices shown (2 of 2)]
[im 1/19]
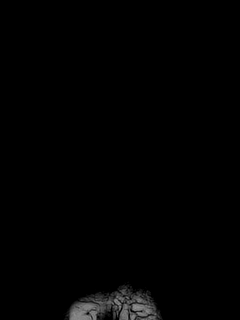
[im 5/19]
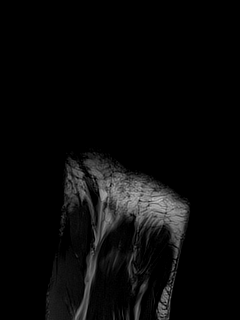
[im 10/19]
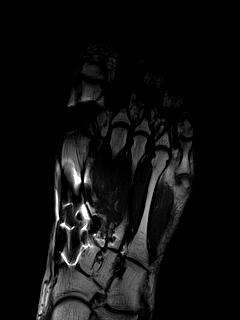
[im 14/19]
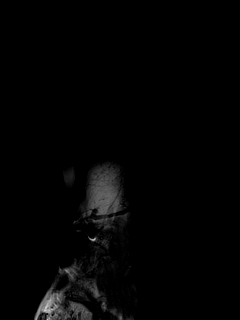
[im 19/19]
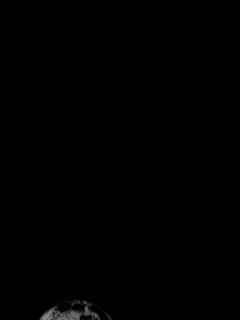

[Series 10: PD fat-sat · sagittal · 3.0mm · 0.50mm/px · 6 of 26 slices shown]
[im 1/26]
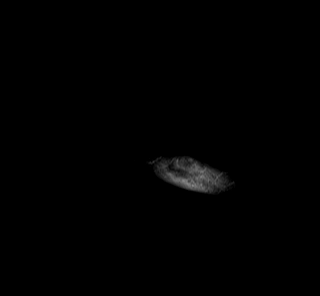
[im 6/26]
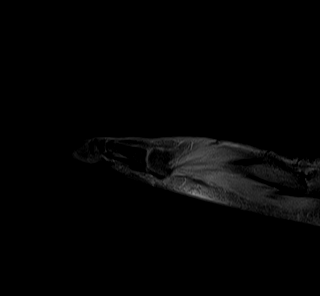
[im 11/26]
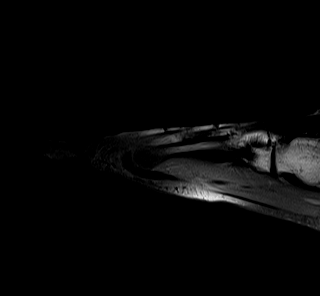
[im 16/26]
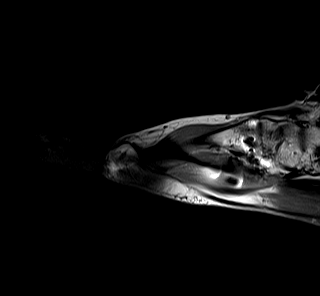
[im 21/26]
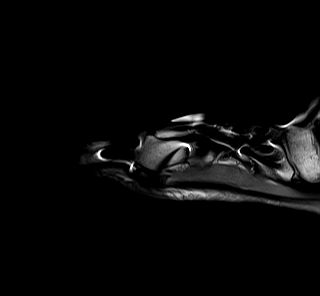
[im 26/26]
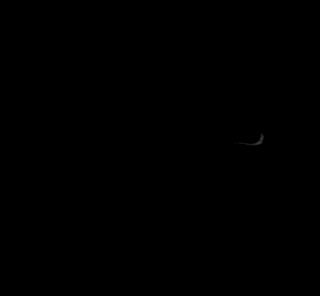

[40 of 40 positions shown; findings below may reference images not displayed]

FINDINGS: Bones/Joint/Cartilage

Prominent susceptibility artifact within the first ray involving the
great toe proximal phalanx, first metatarsal, and medial cuneiform
related to first TMT joint arthrodesis and bunionectomy surgery.
Artifact results in poor fat saturation of the adjacent structures.
Solid arthrodesis across the first TMT joint.

Remaining osseous structures demonstrate normal signal. No fracture
or malalignment. No bone marrow edema. No periostitis. Joint spaces
are maintained. No significant arthropathy. No joint effusion.

Ligaments

Nonvisualization of the Lisfranc ligament secondary to adjacent
artifact. Collateral ligaments of the forefoot appear intact. No
appreciable capsular thickening or pericapsular edema associated
with the first MTP joint within the limitations of this exam.
Visualized plantar fascia is within normal limits without thickening
or tear.

Muscles and Tendons

Normal muscle bulk and signal intensity without edema, atrophy, or
fatty infiltration. Flexor and extensor tendons intact without
tendinosis, tear, or tenosynovitis.

Soft tissues

No soft tissue mass or fluid collection within the first
intermetatarsal space. Trace fluid within the third intermetatarsal
space (series 7, image 28). No solid intermetatarsal space mass to
suggest neuroma. No soft tissue edema or fluid collection.
IMPRESSION: 1. No evidence of Morton's neuroma.
2. Trace fluid within the third intermetatarsal space, which may
reflect mild bursitis.
3. Postoperative changes related to first TMT joint arthrodesis and
bunionectomy surgery. No evidence of hardware complication. No acute
osseous findings.

## 2022-12-31 ENCOUNTER — Ambulatory Visit (INDEPENDENT_AMBULATORY_CARE_PROVIDER_SITE_OTHER): Payer: 59 | Admitting: Pulmonary Disease

## 2022-12-31 ENCOUNTER — Encounter: Payer: Self-pay | Admitting: Pulmonary Disease

## 2022-12-31 VITALS — BP 104/62 | HR 84 | Temp 98.4°F | Ht 63.0 in | Wt 180.8 lb

## 2022-12-31 DIAGNOSIS — G4730 Sleep apnea, unspecified: Secondary | ICD-10-CM | POA: Diagnosis not present

## 2022-12-31 DIAGNOSIS — J45991 Cough variant asthma: Secondary | ICD-10-CM

## 2022-12-31 MED ORDER — FLUTICASONE-SALMETEROL 500-50 MCG/ACT IN AEPB: 1.0000 | INHALATION_SPRAY | Freq: Two times a day (BID) | RESPIRATORY_TRACT | 3 refills | Status: DC

## 2022-12-31 NOTE — Patient Instructions (Signed)
Nice to see you again  I make you have plenty refills of the Wixela  I will order home sleep test to evaluate for sleep apnea  If we diagnose sleep apnea I will order a CPAP machine, you will need to schedule a follow-up appointment with me 31 to 90 days after you receive the machine.

## 2022-12-31 NOTE — Progress Notes (Signed)
@Patient  ID: Beverly Ford, female    DOB: April 01, 1976, 47 y.o.   MRN: [id number]  Chief Complaint  Patient presents with   Follow-up    Doing well.    Referring provider: Shon Hale, *  HPI:   47 y.o. whom we are seeing for evaluation of cough felt related to asthma improved with ICS/LABA therapy.    Returns for routine follow-up.  Cough seems well-controlled on high-dose Wixela.  She notes when she is been out of her period, cough returns.  Discussed about therapy but she recently wishes to stay on current medicine given significant improvement.  She does report loud snoring at home.  Husband has observed her not breathing at times.  We discussed role and rationale for diagnostic testing for OSA as well as the proposed treatment plans if this is present.  HPI at initial visit: She was in usual health.  Developed cough 03/08/2022.  She denies any preceding illness.  No viral symptoms etc.  Just cough.  Has persisted since.  Worse in the evenings.  No position to make things better or worse.  No seasonal or environmental factors she can identify to make things better or worse.  She took prednisone for few days prescribed by her PCP.  Seems like cough improved with this but only for a few days and then subsequent is returned.  No other relieving or exacerbating factors.  She had chest x-ray 03/2021 personally reviewed and interpreted as clear lungs bilaterally with mild hyperinflation on the lateral view.  Most recent chest x-ray 03/2022 personally reviewed and interpreted as clear lungs and unchanged from prior, no lateral to view.  She reports a history of recurrent bronchitis.  At least once a year in the December/January timeframe.  For several years.  No prior history of asthma but has been brought up in the past due to this.  She does have albuterol but does not feel like it helps much.  She denies any significant shortness of breath or dyspnea on exertion..   Questionaires /  Pulmonary Flowsheets:   ACT:      No data to display          MMRC:     No data to display          Epworth:      No data to display          Tests:   FENO:  No results found for: "NITRICOXIDE"  PFT:     No data to display          WALK:      No data to display          Imaging: Personally reviewed and as per EMR discussion this note No results found.  Lab Results: Personally reviewed CBC    Component Value Date/Time   WBC 10.5 09/05/2021 2007   RBC 4.83 09/05/2021 2007   HGB 13.2 09/05/2021 2007   HCT 40.3 09/05/2021 2007   PLT 297 09/05/2021 2007   MCV 83.4 09/05/2021 2007   MCH 27.3 09/05/2021 2007   MCHC 32.8 09/05/2021 2007   RDW 13.9 09/05/2021 2007   LYMPHSABS 1.0 03/30/2021 1732   MONOABS 0.5 03/30/2021 1732   EOSABS 0.1 03/30/2021 1732   BASOSABS 0.0 03/30/2021 1732    BMET    Component Value Date/Time   NA 137 09/05/2021 2007   K 3.4 (L) 09/05/2021 2007   CL 100 09/05/2021 2007   CO2 26 09/05/2021 2007  GLUCOSE 101 (H) 09/05/2021 2007   BUN 11 09/05/2021 2007   CREATININE 0.85 09/05/2021 2007   CALCIUM 9.1 09/05/2021 2007   GFRNONAA >60 09/05/2021 2007    BNP No results found for: "BNP"  ProBNP No results found for: "PROBNP"  Specialty Problems       Pulmonary Problems   OSA (obstructive sleep apnea)    Last Assessment & Plan:  Formatting of this note might be different from the original. Suspected.  She reports EDS, snoring, fatigue, morning headaches and witnessed apneas.  No previous cardiac or pulmonary diseases.  She has never smoked.  No parasomnias.  Will schedule her for a sleep study  Therapeutic lifestyle changes were discussed. Avoid supine position.  Avoid alcohol within 4 hours of sleep, sedatives, hypnotics and any other medications that could make you sleepy. Avoid driving or operating heavy machinery if excessively drowsy. Assure 7-8 h sleep nightly to maximize the effectiveness. Formatting  of this note might be different from the original. Last Assessment & Plan:  Suspected.  She reports EDS, snoring, fatigue, morning headaches and witnessed apneas.  No previous cardiac or pulmonary diseases.  She has never smoked.  No parasomnias.  Will schedule her for a sleep study  Therapeutic lifestyle changes were discussed. Avoid supine position.  Avoid alcohol within 4 hours of sleep, sedatives, hypnotics and any other medications that could make you sleepy. Avoid driving or operating heavy machinery if excessively drowsy. Assure 7-8 h sleep nightly to maximize the effectiveness. Formatting of this note might be different from the original. Formatting of this note might be different from the original. Last Assessment & Plan:  Suspected.  She reports EDS, snoring, fatigue, morning headaches and witnessed apneas.  No previous cardiac or pulmonary diseases.  She has never smoked.  No parasomnias.  Will schedule her for a sleep study  Therapeutic lifestyle changes were discussed. Avoid supine position.  Avoid alcohol within 4 hours of sleep, sedatives, hypnotics and any other medications that could make you sleepy. Avoid driving or operating heavy machinery if excessively drowsy. Assure 7-8 h sleep nightly to maximize the effectiveness. Last Assessment & Plan:  Formatting of this note might be different from the original. Suspected.  She reports EDS, snoring, fatigue, morning headaches and witnessed apneas.  No previous cardiac or pulmonary diseases.  She has never smoked.  No parasomnias.  Will schedule her for a sleep study  Therapeutic lifestyle changes were discussed. Avoid supine position.  Avoid alcohol within 4 hours of sleep, sedatives, hypnotics and any other medications that could make you sleepy. Avoid driving or operating heavy machinery if excessively drowsy. Assure 7-8 h sleep nightly to maximize the effectiveness.       Allergies  Allergen Reactions   Other     Other  reaction(s): Other IVP dye  Unknown reaction IVP dye  Unknown reaction    Betadine [Povidone Iodine] Hives    blisters   Iodinated Contrast Media     Other reaction(s): Other (See Comments) IVP dye  Unknown reaction   Shellfish Allergy Hives    All shellfish   Topiramate     Other reaction(s): Other kidney stones kidney stones     Immunization History  Administered Date(s) Administered   DTaP 01/02/1975, 01/31/1975, 05/11/1975, 07/31/1980   Hepatitis A, Ped/Adol-2 Dose 05/20/2008, 11/09/2008   Hepatitis B, PED/ADOLESCENT 05/20/2008, 07/07/2008, 11/09/2008   IPV 01/02/1975, 03/03/1975, 05/11/1975, 07/15/1976   Influenza Split 01/24/2017   Influenza,inj,Quad PF,6-35 Mos 02/11/2018   Influenza,inj,quad, With  Preservative 01/24/2017   MMR 02/25/1976, 01/17/1989   PFIZER Comirnaty(Gray Top)Covid-19 Tri-Sucrose Vaccine 07/14/2019, 08/04/2019   PFIZER(Purple Top)SARS-COV-2 Vaccination 07/14/2019, 08/04/2019   PPD Test 01/25/2017   Td 04/23/2013   Tdap 05/20/2008    Past Medical History:  Diagnosis Date   GAD (generalized anxiety disorder)    History of diverticulitis of colon    History of DVT of lower extremity 11/2015   per pt post op left bunion LLE dvt and completed blood thinner   History of kidney stones    Left ureteral stone    MDD (major depressive disorder)    Mild obstructive sleep apnea    per study in epic 11-08-2015,  per pt no cpap recommendation   Nephrolithiasis    bilateral nonobstructive    Thoracic outlet syndrome    [facility] -- intermittant symptoms, but no spine related and been referred for second opinion    Tobacco History: Social History   Tobacco Use  Smoking Status Never  Smokeless Tobacco Never   Counseling given: Not Answered   Continue to not smoke  Outpatient Encounter Medications as of 12/31/2022  Medication Sig   albuterol (VENTOLIN HFA) 108 (90 Base) MCG/ACT inhaler Inhale into the lungs.    chlorthalidone (HYGROTON) 25 MG tablet    DULoxetine (CYMBALTA) 20 MG capsule Take 1 capsule by mouth daily.   ENBREL SURECLICK 50 MG/ML injection Inject 50 mg into the skin once a week.   folic acid (FOLVITE) 1 MG tablet Take 1 mg by mouth 2 (two) times daily.   hydrOXYzine (ATARAX/VISTARIL) 25 MG tablet Take 25 mg by mouth 2 (two) times daily.   ibuprofen (ADVIL) 800 MG tablet Take 1 tablet (800 mg total) by mouth every 6 (six) hours as needed.   potassium chloride (KLOR-CON) 10 MEQ tablet Take 10 mEq by mouth 2 (two) times daily.   triamcinolone (KENALOG) 0.025 % ointment Apply 1 application topically 2 (two) times daily.   [DISCONTINUED] fluticasone-salmeterol (WIXELA INHUB) 500-50 MCG/ACT AEPB Inhale 1 puff into the lungs in the morning and at bedtime.   fluticasone-salmeterol (WIXELA INHUB) 500-50 MCG/ACT AEPB Inhale 1 puff into the lungs in the morning and at bedtime.   traZODone (DESYREL) 50 MG tablet Take 50 mg by mouth at bedtime.   [DISCONTINUED] ipratropium (ATROVENT) 0.06 % nasal spray Place 2 sprays into both nostrils 3 (three) times daily. (Patient not taking: Reported on 12/31/2022)   No facility-administered encounter medications on file as of 12/31/2022.     Review of Systems  Review of Systems  N/a Physical Exam  BP 104/62 (BP Location: Left Arm, Patient Position: Sitting, Cuff Size: Normal)   Pulse 84   Temp 98.4 F (36.9 C) (Oral)   Ht 5\' 3"  (1.6 m)   Wt 180 lb 12.8 oz (82 kg)   SpO2 98%   BMI 32.03 kg/m   Wt Readings from Last 5 Encounters:  12/31/22 180 lb 12.8 oz (82 kg)  07/03/22 183 lb 9.6 oz (83.3 kg)  04/30/22 182 lb 3.2 oz (82.6 kg)  09/05/21 165 lb (74.8 kg)  03/30/21 168 lb (76.2 kg)    BMI Readings from Last 5 Encounters:  12/31/22 32.03 kg/m  07/03/22 32.52 kg/m  04/30/22 32.28 kg/m  09/05/21 29.23 kg/m  03/30/21 29.76 kg/m     Physical Exam  General: Sitting in chair, no acute distress Eyes: EOMI, icterus Neck: Supple no  JVP Pulmonary: Clear, normal work of breathing Cardiovascular: Warm, no edema  Abdomen: Nondistended, bowel sounds present MSK: No synovitis, no joint effusion Neuro: Normal gait, no weakness Psych: Normal mood, full affect  Assessment & Plan:   Chronic cough, cough variant asthma: Present for months in 2023.  Worse in the evenings.  Recurrent bronchitis in the past.  Mild hyperinflation on lateral chest x-ray.  High suspicion for asthma.   High-res CT scan 06/2022 without signs of infiltrate, ILD etc., low suspicion for methotrexate toxicity.  Largely improved with ICS/LABA therapy.  High-dose Wixela refilled today.  Sleep disordered breathing: Present reports mild morning and episodes where she stops breathing at night.  Home sleep test ordered today.  CPAP therapy detailed discussed.  If present will need a follow-up visit 31 to 9 days after receiving CPAP machine.  She expressed understanding.   Return in about 1 year (around 12/31/2023).  For routine follow-up.  She would need to be seen sooner for CPAP compliance, which she understands.   Karren Burly, MD 12/31/2022

## 2023-01-09 DIAGNOSIS — G4730 Sleep apnea, unspecified: Secondary | ICD-10-CM

## 2023-01-27 ENCOUNTER — Telehealth: Payer: Self-pay | Admitting: Pulmonary Disease

## 2023-01-27 DIAGNOSIS — G4733 Obstructive sleep apnea (adult) (pediatric): Secondary | ICD-10-CM

## 2023-01-27 DIAGNOSIS — G4730 Sleep apnea, unspecified: Secondary | ICD-10-CM

## 2023-01-27 NOTE — Telephone Encounter (Signed)
Call patient  Sleep study result  Date of study: 01/09/2023  Impression: Moderate obstructive sleep apnea with mild oxygen desaturations  Recommendation: DME referral  Recommend CPAP therapy for moderate obstructive sleep apnea  Auto titrating CPAP with pressure settings of 5-15 will be appropriate  Encourage weight loss measures  Follow-up in the office 4 to 6 weeks following initiation of treatment

## 2023-01-31 NOTE — Telephone Encounter (Signed)
PT ret call. Pls try again. Thanks [employer].

## 2023-01-31 NOTE — Telephone Encounter (Signed)
ATC x1 LVM for patient to call  our office back regarding sleep study result's.  

## 2023-02-01 NOTE — Telephone Encounter (Signed)
Attempted to call pt but unable to reach. Left message to return call.  

## 2023-02-04 NOTE — Telephone Encounter (Signed)
Calling back for sleep study results

## 2023-02-06 NOTE — Telephone Encounter (Signed)
Patient aware of results of sleep study and recommendation to start cpap therapy.Order placed nothing further needed.

## 2023-02-07 ENCOUNTER — Ambulatory Visit
Admission: RE | Admit: 2023-02-07 | Discharge: 2023-02-07 | Disposition: A | Discharge status: Home / Self Care | Payer: 59 | Source: Ambulatory Visit | Attending: Family Medicine | Admitting: Family Medicine

## 2023-02-07 DIAGNOSIS — M8580 Other specified disorders of bone density and structure, unspecified site: Secondary | ICD-10-CM

## 2023-02-13 ENCOUNTER — Encounter: Payer: Self-pay | Admitting: Pulmonary Disease

## 2023-02-13 ENCOUNTER — Other Ambulatory Visit: Payer: 59

## 2023-02-13 ENCOUNTER — Ambulatory Visit (INDEPENDENT_AMBULATORY_CARE_PROVIDER_SITE_OTHER): Payer: 59 | Admitting: Pulmonary Disease

## 2023-02-13 VITALS — BP 134/80 | HR 84 | Temp 98.2°F | Ht 63.0 in | Wt 182.2 lb

## 2023-02-13 DIAGNOSIS — R0609 Other forms of dyspnea: Secondary | ICD-10-CM

## 2023-02-13 LAB — D-DIMER, QUANTITATIVE: D-Dimer, Quant: 0.26 ug{FEU}/mL (ref <0.50)

## 2023-02-13 MED ORDER — PREDNISONE 20 MG PO TABS: 20.0000 mg | ORAL_TABLET | Freq: Every day | ORAL | 0 refills | Status: AC

## 2023-02-13 NOTE — Patient Instructions (Signed)
Nice to see you again  I hope some of the chest pressure or shortness of breath related to couple of bug while traveling and exacerbating underlying asthma  Take prednisone 20 mg daily for 5 days then stop stop, see if this helps  Anytime someone travels and develop symptoms especially on a plane or sitting down for a longer period of time, I worry about the possibility of a blood clot.  Your calf is little tender which is a little bit concerning as well.  I ordered a blood test, if this is normal or negative then we do not need to worry about blood clot.  If it is elevated we will need to get a CT scan of the chest with contrast to see if there is blood clot in the lungs or pulmonary embolism.  I will let you know the results soon as I see it.  I will look into CPAP.  Once a CPAP machine is delivered, please schedule a follow-up visit 31 to 90 days after receiving the CPAP machine.  Return to clinic based on timing of CPAP delivery

## 2023-02-13 NOTE — Progress Notes (Signed)
D-Dimer is WNL - reassuring no PE

## 2023-02-13 NOTE — Progress Notes (Signed)
@Patient  ID: Beverly Ford, female    DOB: [birth year], 47 y.o.   MRN: [id number]  Chief Complaint  Patient presents with   Follow-up    Doing well.    Referring provider: Shon Hale, *  HPI:   47 y.o. whom we are seeing for evaluation of cough felt related to asthma improved with ICS/LABA therapy.    Returns for routine follow-up after home sleep study.  This demonstrated moderate OSA with mild oxygen desaturation.  Discussed at length recommendation for CPAP therapy for which she agrees.  She is yet to be contacted by DME company.  Looks like order was placed 10/16.  Cough remains well.  She returned from a trip to [facility] about a week and a half ago.  Flew out on a Wednesday came back on Monday.  On the airplane she felt a chest pressure or discomfort.  Difficult getting deep breath.  This sensation has persisted for the last week and a half.  Not improving despite no longer writing on plane.  Discussed possible exacerbation of asthma.  But with travel concern for PE.  Discussed diagnostic strategy as detailed below.  HPI at initial visit: She was in usual health.  Developed cough 03/08/2022.  She denies any preceding illness.  No viral symptoms etc.  Just cough.  Has persisted since.  Worse in the evenings.  No position to make things better or worse.  No seasonal or environmental factors she can identify to make things better or worse.  She took prednisone for few days prescribed by her PCP.  Seems like cough improved with this but only for a few days and then subsequent is returned.  No other relieving or exacerbating factors.  She had chest x-ray 03/2021 personally reviewed and interpreted as clear lungs bilaterally with mild hyperinflation on the lateral view.  Most recent chest x-ray 03/2022 personally reviewed and interpreted as clear lungs and unchanged from prior, no lateral to view.  She reports a history of recurrent bronchitis.  At least once a year in the  December/January timeframe.  For several years.  No prior history of asthma but has been brought up in the past due to this.  She does have albuterol but does not feel like it helps much.  She denies any significant shortness of breath or dyspnea on exertion..   Questionaires / Pulmonary Flowsheets:   ACT:      No data to display          MMRC:     No data to display          Epworth:      No data to display          Tests:   FENO:  No results found for: "NITRICOXIDE"  PFT:     No data to display          WALK:      No data to display          Imaging: Personally reviewed and as per EMR discussion this note DG BONE DENSITY (DXA)  Result Date: 02/07/2023 EXAM: DUAL X-RAY ABSORPTIOMETRY (DXA) FOR BONE MINERAL DENSITY IMPRESSION: Referring Physician: Shon Hale Your patient completed a bone mineral density test using GE Lunar iDXA system (analysis version: 16). Technologist:    lmn PATIENT: Name: Beverly Ford, Beverly Ford Patient ID: [id number] Birth Date: 03-14-1976 Height: 62.5 in. Sex: Female Measured: 02/07/2023 Weight: 180.8 lbs. Indications: Caucasian, Cymbalta, Depression, High risk medication use, History of Fracture (  Adult) (V15.51), LSpine Surgery, Premenopausal, Rheumatoid Arthritis (714.0), Trazodone Fractures: Left Foot, Right Foot Treatments: None Site Region Measured Date Measured Age Age Matched BMD Sign. Change * Z-score AP Spine L1-L2 02/07/2023 48.2 -0.5 1.061 g/cm2 DualFemur Neck Left 02/07/2023 48.2 -0.4 0.883 g/cm2 DualFemur Total Mean 02/07/2023 48.2 0.1 0.972 g/cm2 ASSESSMENT: AP Spine - With a Z-score of -0.6, this patient's BMD is within the expected range for age. RECOMMENDATION: All patients should optimize calcium and vitamin D intake. FOLLOW-UP: Patients with diagnosis of osteoporosis or at high risk for fracture should have regular bone mineral density tests. Patients eligible for Medicare are allowed routine testing every 2 years. The  testing frequency can be increased to one year for patients who have rapidly progressing disease, are receiving or discontinuing medical therapy to restore bone mass, or have additional risk factors. I have reviewed this study and agree with the findings. [facility] Electronically Signed   By: Frederico Hamman M.D.   On: 02/07/2023 08:56    Lab Results: Personally reviewed CBC    Component Value Date/Time   WBC 10.5 09/05/2021 2007   RBC 4.83 09/05/2021 2007   HGB 13.2 09/05/2021 2007   HCT 40.3 09/05/2021 2007   PLT 297 09/05/2021 2007   MCV 83.4 09/05/2021 2007   MCH 27.3 09/05/2021 2007   MCHC 32.8 09/05/2021 2007   RDW 13.9 09/05/2021 2007   LYMPHSABS 1.0 03/30/2021 1732   MONOABS 0.5 03/30/2021 1732   EOSABS 0.1 03/30/2021 1732   BASOSABS 0.0 03/30/2021 1732    BMET    Component Value Date/Time   NA 137 09/05/2021 2007   K 3.4 (L) 09/05/2021 2007   CL 100 09/05/2021 2007   CO2 26 09/05/2021 2007   GLUCOSE 101 (H) 09/05/2021 2007   BUN 11 09/05/2021 2007   CREATININE 0.85 09/05/2021 2007   CALCIUM 9.1 09/05/2021 2007   GFRNONAA >60 09/05/2021 2007    BNP No results found for: "BNP"  ProBNP No results found for: "PROBNP"  Specialty Problems       Pulmonary Problems   OSA (obstructive sleep apnea)    Last Assessment & Plan:  Formatting of this note might be different from the original. Suspected.  She reports EDS, snoring, fatigue, morning headaches and witnessed apneas.  No previous cardiac or pulmonary diseases.  She has never smoked.  No parasomnias.  Will schedule her for a sleep study  Therapeutic lifestyle changes were discussed. Avoid supine position.  Avoid alcohol within 4 hours of sleep, sedatives, hypnotics and any other medications that could make you sleepy. Avoid driving or operating heavy machinery if excessively drowsy. Assure 7-8 h sleep nightly to maximize the effectiveness. Formatting of this note might be different from the  original. Last Assessment & Plan:  Suspected.  She reports EDS, snoring, fatigue, morning headaches and witnessed apneas.  No previous cardiac or pulmonary diseases.  She has never smoked.  No parasomnias.  Will schedule her for a sleep study  Therapeutic lifestyle changes were discussed. Avoid supine position.  Avoid alcohol within 4 hours of sleep, sedatives, hypnotics and any other medications that could make you sleepy. Avoid driving or operating heavy machinery if excessively drowsy. Assure 7-8 h sleep nightly to maximize the effectiveness. Formatting of this note might be different from the original. Formatting of this note might be different from the original. Last Assessment & Plan:  Suspected.  She reports EDS, snoring, fatigue, morning headaches and witnessed apneas.  No previous cardiac or  pulmonary diseases.  She has never smoked.  No parasomnias.  Will schedule her for a sleep study  Therapeutic lifestyle changes were discussed. Avoid supine position.  Avoid alcohol within 4 hours of sleep, sedatives, hypnotics and any other medications that could make you sleepy. Avoid driving or operating heavy machinery if excessively drowsy. Assure 7-8 h sleep nightly to maximize the effectiveness. Last Assessment & Plan:  Formatting of this note might be different from the original. Suspected.  She reports EDS, snoring, fatigue, morning headaches and witnessed apneas.  No previous cardiac or pulmonary diseases.  She has never smoked.  No parasomnias.  Will schedule her for a sleep study  Therapeutic lifestyle changes were discussed. Avoid supine position.  Avoid alcohol within 4 hours of sleep, sedatives, hypnotics and any other medications that could make you sleepy. Avoid driving or operating heavy machinery if excessively drowsy. Assure 7-8 h sleep nightly to maximize the effectiveness.       Allergies  Allergen Reactions   Other     Other reaction(s): Other IVP dye  Unknown reaction IVP  dye  Unknown reaction    Betadine [Povidone Iodine] Hives    blisters   Iodinated Contrast Media     Other reaction(s): Other (See Comments) IVP dye  Unknown reaction   Shellfish Allergy Hives    All shellfish   Topiramate     Other reaction(s): Other kidney stones kidney stones     Immunization History  Administered Date(s) Administered   DTaP 01/02/1975, 01/31/1975, 05/11/1975, 07/31/1980   Hepatitis A, Ped/Adol-2 Dose 05/20/2008, 11/09/2008   Hepatitis B, PED/ADOLESCENT 05/20/2008, 07/07/2008, 11/09/2008   IPV 01/02/1975, 03/03/1975, 05/11/1975, 07/15/1976   Influenza Split 01/24/2017   Influenza,inj,Quad PF,6-35 Mos 02/11/2018   Influenza,inj,quad, With Preservative 01/24/2017   MMR 02/25/1976, 01/17/1989   PFIZER Comirnaty(Gray Top)Covid-19 Tri-Sucrose Vaccine 07/14/2019, 08/04/2019   PFIZER(Purple Top)SARS-COV-2 Vaccination 07/14/2019, 08/04/2019   PPD Test 01/25/2017   Td 04/23/2013   Tdap 05/20/2008    Past Medical History:  Diagnosis Date   GAD (generalized anxiety disorder)    History of diverticulitis of colon    History of DVT of lower extremity 11/2015   per pt post op left bunion LLE dvt and completed blood thinner   History of kidney stones    Left ureteral stone    MDD (major depressive disorder)    Mild obstructive sleep apnea    per study in epic 11-08-2015,  per pt no cpap recommendation   Nephrolithiasis    bilateral nonobstructive    Thoracic outlet syndrome    [facility] -- intermittant symptoms, but no spine related and been referred for second opinion    Tobacco History: Social History   Tobacco Use  Smoking Status Never  Smokeless Tobacco Never   Counseling given: Not Answered   Continue to not smoke  Outpatient Encounter Medications as of 02/13/2023  Medication Sig   albuterol (VENTOLIN HFA) 108 (90 Base) MCG/ACT inhaler Inhale into the lungs.   chlorthalidone (HYGROTON) 25 MG tablet    DULoxetine  (CYMBALTA) 20 MG capsule Take 1 capsule by mouth daily.   ENBREL SURECLICK 50 MG/ML injection Inject 50 mg into the skin once a week.   fluticasone-salmeterol (WIXELA INHUB) 500-50 MCG/ACT AEPB Inhale 1 puff into the lungs in the morning and at bedtime.   folic acid (FOLVITE) 1 MG tablet Take 1 mg by mouth 2 (two) times daily.   hydrOXYzine (ATARAX/VISTARIL) 25 MG tablet Take 25 mg  by mouth 2 (two) times daily.   ibuprofen (ADVIL) 800 MG tablet Take 1 tablet (800 mg total) by mouth every 6 (six) hours as needed.   potassium chloride (KLOR-CON) 10 MEQ tablet Take 10 mEq by mouth 2 (two) times daily.   predniSONE (DELTASONE) 20 MG tablet Take 1 tablet (20 mg total) by mouth daily with breakfast for 5 days.   triamcinolone (KENALOG) 0.025 % ointment Apply 1 application topically 2 (two) times daily.   traZODone (DESYREL) 50 MG tablet Take 50 mg by mouth at bedtime.   No facility-administered encounter medications on file as of 02/13/2023.     Review of Systems  Review of Systems  N/a Physical Exam  BP 134/80 (BP Location: Right Arm, Patient Position: Sitting, Cuff Size: Normal)   Pulse 84   Temp 98.2 F (36.8 C) (Oral)   Ht 5\' 3"  (1.6 m)   Wt 182 lb 3.2 oz (82.6 kg)   SpO2 97%   BMI 32.28 kg/m   Wt Readings from Last 5 Encounters:  02/13/23 182 lb 3.2 oz (82.6 kg)  12/31/22 180 lb 12.8 oz (82 kg)  07/03/22 183 lb 9.6 oz (83.3 kg)  04/30/22 182 lb 3.2 oz (82.6 kg)  09/05/21 165 lb (74.8 kg)    BMI Readings from Last 5 Encounters:  02/13/23 32.28 kg/m  12/31/22 32.03 kg/m  07/03/22 32.52 kg/m  04/30/22 32.28 kg/m  09/05/21 29.23 kg/m     Physical Exam  General: Sitting in chair, no acute distress Eyes: EOMI, icterus Neck: Supple no JVP Pulmonary: Clear, normal work of breathing Cardiovascular: Warm, no edema Abdomen: Nondistended, bowel sounds present MSK: No synovitis, no joint effusion, mild tenderness with palpation of the posterior right calf, circumference  of both calfs appear symmetric Neuro: Normal gait, no weakness Psych: Normal mood, full affect  Assessment & Plan:   Chronic cough, cough variant asthma: Present for months in 2023.  Worse in the evenings.  Recurrent bronchitis in the past.  Mild hyperinflation on lateral chest x-ray.  High suspicion for asthma.   High-res CT scan 06/2022 without signs of infiltrate, ILD etc., low suspicion for methotrexate toxicity.  Largely improved with ICS/LABA therapy.  Continue high-dose Wixela  Sleep disordered breathing: Present reports mild morning and episodes where she stops breathing at night.  Home sleep test 01/2023 with moderate OSA with mild desaturations.  Recommended CPAP, has yet to be contacted will look into this.  Chest tightness discomfort pressure: With recent travel.  Right calf tenderness.  Well score of 3 for PE.  Intermediate risk.  D-dimer.  If negative no further workup, if positive will need stat CTA PE protocol.  Possible related to mild asthma exacerbation although lungs are clear, prednisone 20 mg for 5 days sent to pharmacy.   No follow-ups on file.  Advise follow-up 31 to 90 days for CPAP adherence after CPAP machine delivered.   Karren Burly, MD 02/13/2023  I spent 40 minutes in the care of the patient occluding face-to-face visit, review of records, coordination of care

## 2023-02-13 NOTE — Telephone Encounter (Signed)
Seen in clinic today.  She is yet to be contacted by DME company for CPAP machine.  Order placed 10/16.  Can we look into this and see if this can be expedited?  Thank you!

## 2023-02-13 NOTE — Telephone Encounter (Signed)
I just spoke with Terri C with [employer] and she has pulled this order. Terri stated they do the orders first come, first served. Terri stated she would call the patient.

## 2023-03-11 ENCOUNTER — Telehealth: Payer: Self-pay | Admitting: Pulmonary Disease

## 2023-03-11 NOTE — Telephone Encounter (Signed)
Patient has not received a CPAP Machine from [facility] yet. She has been waiting for about a month. They have the order per last encounter and patient but have not issues the patient with a CPAP. Patient is wondering if it is possible for her to switch companies. Please call and advise [phone].

## 2023-03-12 ENCOUNTER — Other Ambulatory Visit: Payer: Self-pay | Admitting: Medical Genetics

## 2023-03-12 DIAGNOSIS — Z006 Encounter for examination for normal comparison and control in clinical research program: Secondary | ICD-10-CM

## 2023-04-15 ENCOUNTER — Ambulatory Visit: Payer: 59 | Admitting: Pulmonary Disease

## 2023-04-15 VITALS — BP 102/69 | HR 78 | Ht 63.0 in | Wt 169.6 lb

## 2023-04-15 DIAGNOSIS — G4733 Obstructive sleep apnea (adult) (pediatric): Secondary | ICD-10-CM

## 2023-04-15 NOTE — Patient Instructions (Signed)
Continue using your CPAP on a nightly basis  He shows it is working well in controlling the events well  I will see you in about 6 months  Call us with significant concerns

## 2023-04-15 NOTE — Progress Notes (Signed)
Beverly Ford    [id number]    [birth year]  Primary Care Physician:Timberlake, Meridee Score, MD  Referring Physician: Shon Hale, MD [address] [place],  [place] [address]  Chief complaint:   Patient being seen for follow-up of obstructive sleep apnea  HPI:  Diagnosed with moderate obstructive sleep apnea Has been using CPAP therapy  She feels she is benefiting from CPAP use  Download from the machine shows that the machine is effective  Occasionally wakes up feeling like she has too much pressure but this happens maybe once or twice a week  She feels she is resting better Waking up feeling like she has had a good nights rest  She has travel plans for few weeks from here where she will be going away for about a month She will be taking her CPAP with her She was worried about the humidification, encouraged her to try CPAP without humidification to see if she is able to tolerate it  She is managing to get about 6 any arthralgias of sleep  Does have a history of asthma which is better controlled at present, maintained on inhalers   Outpatient Encounter Medications as of 04/15/2023  Medication Sig   albuterol (VENTOLIN HFA) 108 (90 Base) MCG/ACT inhaler Inhale into the lungs.   chlorthalidone (HYGROTON) 25 MG tablet    DULoxetine (CYMBALTA) 20 MG capsule Take 40 mg by mouth daily.   ENBREL SURECLICK 50 MG/ML injection Inject 50 mg into the skin once a week.   fluticasone-salmeterol (WIXELA INHUB) 500-50 MCG/ACT AEPB Inhale 1 puff into the lungs in the morning and at bedtime.   folic acid (FOLVITE) 1 MG tablet Take 1 mg by mouth 2 (two) times daily.   hydrOXYzine (ATARAX/VISTARIL) 25 MG tablet Take 25 mg by mouth 2 (two) times daily.   ibuprofen (ADVIL) 800 MG tablet Take 1 tablet (800 mg total) by mouth every 6 (six) hours as needed.   potassium chloride (KLOR-CON) 10 MEQ tablet Take 10 mEq by mouth 2 (two) times daily.   triamcinolone (KENALOG)  0.025 % ointment Apply 1 application topically 2 (two) times daily.   traZODone (DESYREL) 50 MG tablet Take 50 mg by mouth at bedtime.   No facility-administered encounter medications on file as of 04/15/2023.    Allergies as of 04/15/2023 - Review Complete 04/15/2023  Allergen Reaction Noted   Other  08/23/2011   Betadine [povidone iodine] Hives 04/04/2020   Iodinated contrast media  08/23/2011   Shellfish allergy Hives 10/31/2018   Topiramate  02/11/2018    Past Medical History:  Diagnosis Date   GAD (generalized anxiety disorder)    History of diverticulitis of colon    History of DVT of lower extremity 11/2015   per pt post op left bunion LLE dvt and completed blood thinner   History of kidney stones    Left ureteral stone    MDD (major depressive disorder)    Mild obstructive sleep apnea    per study in epic 11-08-2015,  per pt no cpap recommendation   Nephrolithiasis    bilateral nonobstructive    Thoracic outlet syndrome    [facility] -- intermittant symptoms, but no spine related and been referred for second opinion    Past Surgical History:  Procedure Laterality Date   BUNIONECTOMY Left 12/08/2015   CYSTOSCOPY/URETEROSCOPY/HOLMIUM LASER/STENT PLACEMENT Left 04/07/2020   Procedure: CYSTOSCOPY LEFT RETROGRADE PYELOGRAM URETEROSCOPY/HOLMIUM LASER/STENT PLACEMENT;  Surgeon:  Crist Fat, MD;  Location[facility];  Service: Urology;  Laterality: Left;   LAPAROSCOPIC CHOLECYSTECTOMY  07/2015   LUMBAR DISC SURGERY  10/05/2016   L5-- S1   LUMBAR FUSION  01/24/2018   L5-- S1   NASAL SEPTUM SURGERY  2006   TUBAL LIGATION Bilateral 2004   URETEROSCOPY WITH HOLMIUM LASER LITHOTRIPSY  2013    Family History  Problem Relation Age of Onset   Diabetes Mother    Cancer Father    Kidney disease Father    Hypertension Father     Social History   Socioeconomic History   Marital status: Married    Spouse name: Not on  file   Number of children: Not on file   Years of education: Not on file   Highest education level: Not on file  Occupational History   Not on file  Tobacco Use   Smoking status: Never   Smokeless tobacco: Never  Vaping Use   Vaping status: Never Used  Substance and Sexual Activity   Alcohol use: Yes    Comment: social   Drug use: Never   Sexual activity: Not on file  Other Topics Concern   Not on file  Social History Narrative   Not on file   Social Drivers of Health   Financial Resource Strain: Not on file  Food Insecurity: Not on file  Transportation Needs: Not on file  Physical Activity: Not on file  Stress: Not on file  Social Connections: Unknown (08/25/2021)   Received from [facility], [facility]   Social Network    Social Network: Not on file  Intimate Partner Violence: Unknown (07/27/2021)   Received from [facility], [facility]   HITS    Physically Hurt: Not on file    Insult or Talk Down To: Not on file    Threaten Physical Harm: Not on file    Scream or Curse: Not on file    Review of Systems  Respiratory:  Positive for apnea. Negative for shortness of breath.   Psychiatric/Behavioral:  Positive for sleep disturbance.     Vitals:   04/15/23 0939  BP: 102/69  Pulse: 78  SpO2: 95%     Physical Exam Constitutional:      Appearance: She is obese.  HENT:     Head: Normocephalic.     Mouth/Throat:     Mouth: Mucous membranes are moist.  Eyes:     General: No scleral icterus. Cardiovascular:     Rate and Rhythm: Normal rate and regular rhythm.     Heart sounds: No murmur heard.    No friction rub.  Pulmonary:     Effort: No respiratory distress.     Breath sounds: No stridor. No wheezing or rhonchi.  Musculoskeletal:     Cervical back: No rigidity or tenderness.  Neurological:     Mental Status: She is alert.  Psychiatric:        Mood and Affect: Mood normal.    Data Reviewed: Sleep study was reviewed showing AHI of  22.2  Compliance data reviewed showing 93% compliance [employer] of 5-15 Residual AHI of 0.7  Assessment:  Moderate obstructive sleep apnea -Appears adequately treated with CPAP therapy  Daytime sleepiness is better  Asthma -Symptoms well-controlled at present  Plan/Recommendations: Encouraged to continue using CPAP on a nightly basis  We can make adjustments to CPAP if she is still waking up many times with increased pressure, pressure may be limited to 5-11  if needed  Tentative follow-up in about 6 months  Encouraged to call us with significant concerns   Virl Diamond MD [facility] 04/15/2023, 10:01 AM  CC: Shon Hale, *

## 2023-06-03 ENCOUNTER — Ambulatory Visit (INDEPENDENT_AMBULATORY_CARE_PROVIDER_SITE_OTHER): Payer: 59 | Admitting: Obstetrics and Gynecology

## 2023-06-03 ENCOUNTER — Encounter: Payer: Self-pay | Admitting: Obstetrics and Gynecology

## 2023-06-03 VITALS — BP 104/78 | HR 81 | Temp 98.1°F | Ht 62.25 in | Wt 160.0 lb

## 2023-06-03 DIAGNOSIS — N939 Abnormal uterine and vaginal bleeding, unspecified: Secondary | ICD-10-CM | POA: Insufficient documentation

## 2023-06-03 DIAGNOSIS — N951 Menopausal and female climacteric states: Secondary | ICD-10-CM | POA: Diagnosis not present

## 2023-06-03 DIAGNOSIS — N941 Unspecified dyspareunia: Secondary | ICD-10-CM | POA: Diagnosis not present

## 2023-06-03 DIAGNOSIS — N958 Other specified menopausal and perimenopausal disorders: Secondary | ICD-10-CM | POA: Insufficient documentation

## 2023-06-03 MED ORDER — INTRAROSA 6.5 MG VA INST: 1.0000 | VAGINAL_INSERT | Freq: Every evening | VAGINAL | 11 refills | Status: DC

## 2023-06-03 NOTE — Patient Instructions (Signed)
 Considering applying aquaphor or coconut oil to the vulva after showers. You could also using daily vaginal moisturizers by brands like Good Clean Love and Ah! Yes.

## 2023-06-03 NOTE — Assessment & Plan Note (Signed)
 Symptoms are likely related to perimenopause.  Considering applying aquaphor or coconut oil to the vulva after showers. You could also using daily vaginal moisturizers by brands like Good Clean Love and Ah! Yes.  Reviewed safety profile of low dose vaginal estrogen, however reviewed that higher doses have been associated with DVT, breast and uterine cancer. Recommend using intrarosa  given medical history and relative contraindications.

## 2023-06-03 NOTE — Progress Notes (Signed)
 48 y.o. G74P3000 female s/p BTL with history of endometriosis (status post laparoscopy by prior gynecologist), DVT in 2017 (following foot surgery), anxiety and depression and migraine with aura, history of DVT here for consultation for dyspareunia. Married. 2 daughters and 1 son.  Pain with IC x 1 year, using lube and pain mostly with insertion, however occasionally with deep penetration.  Abnormal bleeding x 1 year and half-  changing tampon once an hour, blood clotting for 7-8 days. Regular periods but now very heavy.  Last GYN care in 2015, however sees PCP for PAPs.  Patient's last menstrual period was 05/13/2023 (approximate). Period Duration (Days): 1-8 Period Pattern: Regular Menstrual Flow: Heavy Menstrual Control: Tampon, Maxi pad Dysmenorrhea: (!) Mild Dysmenorrhea Symptoms: Diarrhea, Headache  Last PAP:     Component Value Date/Time   DIAGPAP  06/24/2020 0000    - Negative for intraepithelial lesion or malignancy (NILM)   HPVHIGH Negative 06/24/2020 0000   ADEQPAP  06/24/2020 0000    Satisfactory for evaluation; transformation zone component PRESENT.   Birth control: BTL Last mammogram: November 2023 Sexually active: Yes  GYN HISTORY: Endometriosis status post prior laparoscopy  OB History  Gravida Para Term Preterm AB Living  3 3 3      SAB IAB Ectopic Multiple Live Births          # Outcome Date GA Lbr Len/2nd Weight Sex Type Anes PTL Lv  3 Term      Vag-Spont  Y   2 Term      Vag-Spont     1 Term      Vag-Spont  Y     Past Medical History:  Diagnosis Date   GAD (generalized anxiety disorder)    History of diverticulitis of colon    History of DVT of lower extremity 11/2015   per pt post op left bunion LLE dvt and completed blood thinner   History of kidney stones    Left ureteral stone    MDD (major depressive disorder)    Mild obstructive sleep apnea    per study in epic 11-08-2015,  per pt no cpap recommendation   Nephrolithiasis    bilateral  nonobstructive    Thoracic outlet syndrome    followed by dr branch [facility] -- intermittant symptoms, but no spine related and been referred for second opinion    Past Surgical History:  Procedure Laterality Date   BUNIONECTOMY Left 12/08/2015   CYSTOSCOPY/URETEROSCOPY/HOLMIUM LASER/STENT PLACEMENT Left 04/07/2020   Procedure: CYSTOSCOPY LEFT RETROGRADE PYELOGRAM URETEROSCOPY/HOLMIUM LASER/STENT PLACEMENT;  Surgeon: Andrez Banker, MD;  Location: [facility];  Service: Urology;  Laterality: Left;   LAPAROSCOPIC CHOLECYSTECTOMY  07/2015   LUMBAR DISC SURGERY  10/05/2016   L5-- S1   LUMBAR FUSION  01/24/2018   L5-- S1   NASAL SEPTUM SURGERY  2006   TUBAL LIGATION Bilateral 2004   URETEROSCOPY WITH HOLMIUM LASER LITHOTRIPSY  2013    Current Outpatient Medications on File Prior to Visit  Medication Sig Dispense Refill   amoxicillin  (AMOXIL ) 875 MG tablet Take 875 mg by mouth 2 (two) times daily.     predniSONE  (STERAPRED UNI-PAK 21 TAB) 5 MG (21) TBPK tablet SMARTSIG:- Tablet(s) By Mouth -     albuterol  (VENTOLIN  HFA) 108 (90 Base) MCG/ACT inhaler Inhale into the lungs.     chlorthalidone (HYGROTON) 25 MG tablet      DULoxetine (CYMBALTA) 20 MG capsule Take 40 mg by mouth daily.     ENBREL  SURECLICK 50 MG/ML injection Inject 50 mg into the skin once a week.     fluticasone -salmeterol (WIXELA INHUB) 500-50 MCG/ACT AEPB Inhale 1 puff into the lungs in the morning and at bedtime. 180 each 3   folic acid (FOLVITE) 1 MG tablet Take 1 mg by mouth 2 (two) times daily.     hydrOXYzine (ATARAX/VISTARIL) 25 MG tablet Take 25 mg by mouth 2 (two) times daily.     ibuprofen  (ADVIL ) 800 MG tablet Take 1 tablet (800 mg total) by mouth every 6 (six) hours as needed. 60 tablet 1   potassium chloride (KLOR-CON) 10 MEQ tablet Take 10 mEq by mouth 2 (two) times daily.     traZODone (DESYREL) 50 MG tablet Take 50 mg by mouth at bedtime.     triamcinolone  (KENALOG ) 0.025 %  ointment Apply 1 application topically 2 (two) times daily. 160 g 0   No current facility-administered medications on file prior to visit.    Allergies  Allergen Reactions   Other     Other reaction(s): Other IVP dye  Unknown reaction IVP dye  Unknown reaction    Betadine [Povidone Iodine] Hives    blisters   Iodinated Contrast Media     Other reaction(s): Other (See Comments) IVP dye  Unknown reaction   Shellfish Allergy Hives    All shellfish   Topiramate     Other reaction(s): Other kidney stones kidney stones       PE Today's Vitals   06/03/23 0920  BP: 104/78  Pulse: 81  Temp: 98.1 F (36.7 C)  TempSrc: Oral  SpO2: 97%  Weight: 160 lb (72.6 kg)  Height: 5' 2.25" (1.581 m)   Body mass index is 29.03 kg/m.  Physical Exam Vitals reviewed. Exam conducted with a chaperone present.  Constitutional:      General: She is not in acute distress.    Appearance: Normal appearance.  HENT:     Head: Normocephalic and atraumatic.     Nose: Nose normal.  Eyes:     Extraocular Movements: Extraocular movements intact.     Conjunctiva/sclera: Conjunctivae normal.  Pulmonary:     Effort: Pulmonary effort is normal.  Genitourinary:    General: Normal vulva.     Exam position: Lithotomy position.     Vagina: Normal. No vaginal discharge.     Cervix: Normal. No cervical motion tenderness, discharge or lesion.     Uterus: Normal. Not enlarged and not tender.      Adnexa: Right adnexa normal and left adnexa normal.  Musculoskeletal:        General: Normal range of motion.     Cervical back: Normal range of motion.  Neurological:     General: No focal deficit present.     Mental Status: She is alert.  Psychiatric:        Mood and Affect: Mood normal.        Behavior: Behavior normal.      Assessment and Plan:        Abnormal uterine bleeding (AUB) Assessment & Plan: Will complete hormonal work-up, US . First line therapies reviewed, including hormonal therapy  and tranexamic acid. Also discussed endometrial ablation, uterine artery embolization and hysterectomy. Patient with migraine with aura and DVT in 2017 following foot surgery. RTO for US . Will discuss further management at that time.   Orders: -     Thyroid  Panel With TSH -     FSH/LH -     Prolactin -  Testos,Total,Free and SHBG (Female) -     US  PELVIS TRANSVAGINAL NON-OB (TV ONLY); Future -     hCG, quantitative, pregnancy -     Estradiol   Perimenopause -     FSH/LH -     Estradiol   Dyspareunia in female Assessment & Plan: Symptoms are likely related to perimenopause.  Considering applying aquaphor or coconut oil to the vulva after showers. You could also using daily vaginal moisturizers by brands like Good Clean Love and Ah! Yes.  Reviewed safety profile of low dose vaginal estrogen, however reviewed that higher doses have been associated with DVT, breast and uterine cancer. Recommend using intrarosa  given medical history and relative contraindications.  Orders: -     Intrarosa ; Place 1 suppository vaginally at bedtime.  Dispense: 30 each; Refill: 11     Romaine Closs, MD

## 2023-06-03 NOTE — Assessment & Plan Note (Signed)
 Considering applying aquaphor or coconut oil to the vulva after showers. You could also using daily vaginal moisturizers by brands like Good Clean Love and Ah! Yes.  Reviewed safety profile of low dose vaginal estrogen, however reviewed that higher doses have been associated with DVT, breast and uterine cancer.

## 2023-06-03 NOTE — Assessment & Plan Note (Signed)
 Will complete hormonal work-up, US . First line therapies reviewed, including hormonal therapy and tranexamic acid. Also discussed endometrial ablation, uterine artery embolization and hysterectomy. Patient with migraine with aura and DVT in 2017 following foot surgery. RTO for US . Will discuss further management at that time.

## 2023-06-05 ENCOUNTER — Encounter: Payer: Self-pay | Admitting: Obstetrics and Gynecology

## 2023-06-08 LAB — THYROID PANEL WITH TSH
Free Thyroxine Index: 2.4 (ref 1.4–3.8)
T3 Uptake: 31 % (ref 22–35)
T4, Total: 7.7 ug/dL (ref 5.1–11.9)
TSH: 1.48 m[IU]/L

## 2023-06-08 LAB — TESTOS,TOTAL,FREE AND SHBG (FEMALE)
Free Testosterone: 1.5 pg/mL (ref 0.1–6.4)
Sex Hormone Binding: 53.1 nmol/L (ref 17–124)
Testosterone, Total, LC-MS-MS: 15 ng/dL (ref 2–45)

## 2023-06-08 LAB — HCG, QUANTITATIVE, PREGNANCY: HCG, Total, QN: <5 m[IU]/mL

## 2023-06-08 LAB — ESTRADIOL: Estradiol: 211 pg/mL

## 2023-06-08 LAB — PROLACTIN: Prolactin: 6.9 ng/mL

## 2023-06-08 LAB — FSH/LH
FSH: 5.1 m[IU]/mL
LH: 3.7 m[IU]/mL

## 2023-06-27 ENCOUNTER — Ambulatory Visit (INDEPENDENT_AMBULATORY_CARE_PROVIDER_SITE_OTHER): Payer: 59

## 2023-06-27 ENCOUNTER — Encounter: Payer: Self-pay | Admitting: Obstetrics and Gynecology

## 2023-06-27 ENCOUNTER — Ambulatory Visit: Payer: 59 | Admitting: Obstetrics and Gynecology

## 2023-06-27 VITALS — BP 106/62 | HR 76 | Temp 98.0°F | Wt 162.0 lb

## 2023-06-27 DIAGNOSIS — N809 Endometriosis, unspecified: Secondary | ICD-10-CM

## 2023-06-27 DIAGNOSIS — N939 Abnormal uterine and vaginal bleeding, unspecified: Secondary | ICD-10-CM

## 2023-06-27 DIAGNOSIS — N941 Unspecified dyspareunia: Secondary | ICD-10-CM | POA: Diagnosis not present

## 2023-06-27 MED ORDER — NORETHINDRONE ACETATE 5 MG PO TABS: 2.5000 mg | ORAL_TABLET | Freq: Every day | ORAL | 3 refills | Status: DC

## 2023-06-27 NOTE — Assessment & Plan Note (Signed)
 Patient with history of endometriosis and perimenopausal.  Recommend multifaceted approach to dyspareunia. Recommend using intrarosa given medical history and relative contraindications. Will start Aygestin for continuous hormonal therapy.  Also for improvement of bleeding profile. Recommend pelvic floor PT for dyspareunia in the setting of endometriosis. Patient is agreeable with this plan. We discussed the tiered approach with trying hormonal therapy and pelvic floor PT for 3 to 6 months. If symptoms have not improved at that time consider increasing hormonal therapy dosing or surgical management.

## 2023-06-27 NOTE — Assessment & Plan Note (Signed)
 See above

## 2023-06-27 NOTE — Progress Notes (Signed)
 48 y.o. G35P3000 female s/p BTL with history of endometriosis (status post laparoscopy by prior gynecologist), DVT in 2017 (following foot surgery), anxiety and depression and migraine with aura, history of DVT here for TVUS and follow-up of dyspareunia (primary concern) and AUB. Marriedx 30+yr. 2 daughters and 1 son.  At her last appointment, she noted: "Pain with IC x 1 year, using lube and pain mostly with insertion, however occasionally with deep penetration.  Abnormal bleeding x 1 year and half-  changing tampon once an hour, blood clotting for 7-8 days. Regular periods but now very heavy.  Last GYN care in 2015, however sees PCP for PAPs."  Today, she reports that she has not yet started the Intrarosa.  She recently returned from a cruise.  Her primary concern is her dyspareunia.  Patient's last menstrual period was 06/27/2023 (exact date). Period Duration (Days): 3-7 Period Pattern: Regular Menstrual Flow: Heavy Menstrual Control: Maxi pad, Tampon Dysmenorrhea: None Dysmenorrhea Symptoms: Headache, Diarrhea  Last PAP:     Component Value Date/Time   DIAGPAP  06/24/2020 0000    - Negative for intraepithelial lesion or malignancy (NILM)   HPVHIGH Negative 06/24/2020 0000   ADEQPAP  06/24/2020 0000    Satisfactory for evaluation; transformation zone component PRESENT.   Birth control: BTL Last mammogram: November 2023 Sexually active: Yes  GYN HISTORY: Endometriosis status post prior laparoscopy  OB History  Gravida Para Term Preterm AB Living  3 3 3      SAB IAB Ectopic Multiple Live Births          # Outcome Date GA Lbr Len/2nd Weight Sex Type Anes PTL Lv  3 Term      Vag-Spont  Y   2 Term      Vag-Spont     1 Term      Vag-Spont  Y     Past Medical History:  Diagnosis Date   GAD (generalized anxiety disorder)    History of diverticulitis of colon    History of DVT of lower extremity 11/2015   per pt post op left bunion LLE dvt and completed blood thinner    History of kidney stones    Left ureteral stone    MDD (major depressive disorder)    Mild obstructive sleep apnea    per study in epic 11-08-2015,  per pt no cpap recommendation   Nephrolithiasis    bilateral nonobstructive    Thoracic outlet syndrome    followed by dr branch [facility] -- intermittant symptoms, but no spine related and been referred for second opinion    Past Surgical History:  Procedure Laterality Date   BUNIONECTOMY Left 12/08/2015   CYSTOSCOPY/URETEROSCOPY/HOLMIUM LASER/STENT PLACEMENT Left 04/07/2020   Procedure: CYSTOSCOPY LEFT RETROGRADE PYELOGRAM URETEROSCOPY/HOLMIUM LASER/STENT PLACEMENT;  Surgeon: Crist Fat, MD;  Location: [facility];  Service: Urology;  Laterality: Left;   LAPAROSCOPIC CHOLECYSTECTOMY  07/2015   LUMBAR DISC SURGERY  10/05/2016   L5-- S1   LUMBAR FUSION  01/24/2018   L5-- S1   NASAL SEPTUM SURGERY  2006   TUBAL LIGATION Bilateral 2004   URETEROSCOPY WITH HOLMIUM LASER LITHOTRIPSY  2013    Current Outpatient Medications on File Prior to Visit  Medication Sig Dispense Refill   albuterol (VENTOLIN HFA) 108 (90 Base) MCG/ACT inhaler Inhale into the lungs.     chlorthalidone (HYGROTON) 25 MG tablet      DULoxetine (CYMBALTA) 20 MG capsule Take 40 mg by mouth daily.  ENBREL SURECLICK 50 MG/ML injection Inject 50 mg into the skin once a week.     fluticasone-salmeterol (WIXELA INHUB) 500-50 MCG/ACT AEPB Inhale 1 puff into the lungs in the morning and at bedtime. 180 each 3   folic acid (FOLVITE) 1 MG tablet Take 1 mg by mouth 2 (two) times daily.     hydrOXYzine (ATARAX/VISTARIL) 25 MG tablet Take 25 mg by mouth 2 (two) times daily.     ibuprofen (ADVIL) 800 MG tablet Take 1 tablet (800 mg total) by mouth every 6 (six) hours as needed. 60 tablet 1   potassium chloride (KLOR-CON) 10 MEQ tablet Take 10 mEq by mouth 2 (two) times daily.     Prasterone (INTRAROSA) 6.5 MG INST Place 1 suppository vaginally at  bedtime. 30 each 11   predniSONE (STERAPRED UNI-PAK 21 TAB) 5 MG (21) TBPK tablet SMARTSIG:- Tablet(s) By Mouth -     traZODone (DESYREL) 50 MG tablet Take 50 mg by mouth at bedtime.     No current facility-administered medications on file prior to visit.    Allergies  Allergen Reactions   Other     Other reaction(s): Other IVP dye  Unknown reaction IVP dye  Unknown reaction    Betadine [Povidone Iodine] Hives    blisters   Iodinated Contrast Media     Other reaction(s): Other (See Comments) IVP dye  Unknown reaction   Shellfish Allergy Hives    All shellfish   Topiramate     Other reaction(s): Other kidney stones kidney stones       PE Today's Vitals   06/27/23 1143  BP: 106/62  Pulse: 76  Temp: 98 F (36.7 C)  TempSrc: Oral  SpO2: 98%  Weight: 162 lb (73.5 kg)   Body mass index is 29.39 kg/m.  Physical Exam Vitals reviewed.  Constitutional:      General: She is not in acute distress.    Appearance: Normal appearance.  HENT:     Head: Normocephalic and atraumatic.     Nose: Nose normal.  Eyes:     Extraocular Movements: Extraocular movements intact.     Conjunctiva/sclera: Conjunctivae normal.  Pulmonary:     Effort: Pulmonary effort is normal.  Musculoskeletal:        General: Normal range of motion.     Cervical back: Normal range of motion.  Neurological:     General: No focal deficit present.     Mental Status: She is alert.  Psychiatric:        Mood and Affect: Mood normal.        Behavior: Behavior normal.    06/27/23 TVUS: Indications: AUB, endometriosis, dyspareunia  Findings:   Uterus: 8.9 x 5.4 x 5.1 cm, anteverted uterus. Endometrial thickness: 5 mm. Left ovary: 2.4 x 1.6 x 1.5 cm, normal-appearing ovary. Right ovary: 1.8 x 1.0 x 1.3 cm, normal-appearing ovary. No free fluid.  Impression:  Normal transvaginal ultrasound.  Rosalyn Gess, MD     Assessment and Plan:        Dyspareunia in female Assessment &  Plan: Patient with history of endometriosis and perimenopausal.  Recommend multifaceted approach to dyspareunia. Recommend using intrarosa given medical history and relative contraindications. Will start Aygestin for continuous hormonal therapy.  Also for improvement of bleeding profile. Recommend pelvic floor PT for dyspareunia in the setting of endometriosis. Patient is agreeable with this plan. We discussed the tiered approach with trying hormonal therapy and pelvic floor PT for 3 to 6 months. If  symptoms have not improved at that time consider increasing hormonal therapy dosing or surgical management.  Orders: -     Ambulatory referral to Physical Therapy  Abnormal uterine bleeding (AUB) Assessment & Plan: Normal hormonal workup and transvaginal ultrasound, reviewed with patient today Will start Aygestin therapy, see above  Orders: -     Norethindrone Acetate; Take 0.5 tablets (2.5 mg total) by mouth daily.  Dispense: 45 tablet; Refill: 3  Endometriosis determined by laparoscopy Assessment & Plan: See above  Orders: -     Norethindrone Acetate; Take 0.5 tablets (2.5 mg total) by mouth daily.  Dispense: 45 tablet; Refill: 3 -     Ambulatory referral to Physical Therapy    Rosalyn Gess, MD

## 2023-06-27 NOTE — Assessment & Plan Note (Signed)
 Normal hormonal workup and transvaginal ultrasound, reviewed with patient today Will start Aygestin therapy, see above

## 2023-07-15 ENCOUNTER — Other Ambulatory Visit: Payer: Self-pay | Admitting: Pulmonary Disease

## 2023-07-15 NOTE — Telephone Encounter (Signed)
 Please advise if appropriate do not see in med list

## 2023-08-01 ENCOUNTER — Other Ambulatory Visit: Payer: Self-pay

## 2023-08-01 DIAGNOSIS — N941 Unspecified dyspareunia: Secondary | ICD-10-CM

## 2023-08-01 MED ORDER — INTRAROSA 6.5 MG VA INST: 1.0000 | VAGINAL_INSERT | Freq: Every evening | VAGINAL | 3 refills | Status: DC

## 2023-08-01 NOTE — Telephone Encounter (Signed)
 Patient has new mail order RX:  [facility] Med refill request: Intrarosa Last OV: 06/27/23 follow up Next OV: 09/18/23 medication follow up Last MMG (if hormonal med) 06/19/21 BI-RADS 1 negative Refill authorized: Intrarosa Please approve or deny as appropriate.

## 2023-08-06 ENCOUNTER — Other Ambulatory Visit: Payer: Self-pay

## 2023-08-06 DIAGNOSIS — N939 Abnormal uterine and vaginal bleeding, unspecified: Secondary | ICD-10-CM

## 2023-08-06 DIAGNOSIS — N809 Endometriosis, unspecified: Secondary | ICD-10-CM

## 2023-08-06 NOTE — Telephone Encounter (Signed)
 Med refill request: aygestin Last AEX: last OV 06/27/23 Next AEX: 09/18/23  Last MMG (if hormonal med) 03/13/22 birads cat 1 neg  Refill authorized: Last rx 06/27/23 #45 with 3 refills. Patient is now using [facility] mail delivery for prescriptions. Please approve or deny

## 2023-08-07 MED ORDER — NORETHINDRONE ACETATE 5 MG PO TABS: 2.5000 mg | ORAL_TABLET | Freq: Every day | ORAL | 3 refills | Status: AC

## 2023-08-08 ENCOUNTER — Other Ambulatory Visit: Payer: Self-pay | Admitting: Pulmonary Disease

## 2023-08-08 MED ORDER — FLUTICASONE-SALMETEROL 500-50 MCG/ACT IN AEPB: 1.0000 | INHALATION_SPRAY | Freq: Two times a day (BID) | RESPIRATORY_TRACT | 4 refills | Status: DC

## 2023-08-08 NOTE — Telephone Encounter (Signed)
 Copied from CRM [id number]. Topic: Clinical - Medication Refill >> Aug 08, 2023  1:21 PM Roseanne Cones wrote: Most Recent Primary Care Visit:   Medication: fluticasone-salmeterol (ADVAIR) 500-50 MCG/ACT AEPB  Has the patient contacted their pharmacy? Yes, pharmacy is actually the requestor. (Agent: If no, request that the patient contact the pharmacy for the refill. If patient does not wish to contact the pharmacy document the reason why and proceed with request.) (Agent: If yes, when and what did the pharmacy advise?)  Is this the correct pharmacy for this prescription? No If no, delete pharmacy and type the correct one.  This is the patient's preferred pharmacy:   [facility] - [place], [place] - [address] [place] [address] Phone: [phone] Fax: [phone]   Has the prescription been filled recently? No  Is the patient out of the medication? No  Has the patient been seen for an appointment in the last year OR does the patient have an upcoming appointment? Yes  Can we respond through MyChart? Yes  Agent: Please be advised that Rx refills may take up to 3 business days. We ask that you follow-up with your pharmacy.

## 2023-09-18 ENCOUNTER — Ambulatory Visit (INDEPENDENT_AMBULATORY_CARE_PROVIDER_SITE_OTHER): Admitting: Obstetrics and Gynecology

## 2023-09-18 ENCOUNTER — Encounter: Payer: Self-pay | Admitting: Obstetrics and Gynecology

## 2023-09-18 VITALS — BP 98/62 | HR 93 | Temp 98.7°F | Wt 149.6 lb

## 2023-09-18 DIAGNOSIS — G43109 Migraine with aura, not intractable, without status migrainosus: Secondary | ICD-10-CM | POA: Diagnosis not present

## 2023-09-18 DIAGNOSIS — N809 Endometriosis, unspecified: Secondary | ICD-10-CM | POA: Diagnosis not present

## 2023-09-18 DIAGNOSIS — N941 Unspecified dyspareunia: Secondary | ICD-10-CM | POA: Diagnosis not present

## 2023-09-18 DIAGNOSIS — N939 Abnormal uterine and vaginal bleeding, unspecified: Secondary | ICD-10-CM

## 2023-09-18 MED ORDER — SUMATRIPTAN SUCCINATE 50 MG PO TABS: 50.0000 mg | ORAL_TABLET | Freq: Once | ORAL | 3 refills | Status: AC

## 2023-09-18 NOTE — Progress Notes (Signed)
 48 y.o. G55P3000 female s/p BTL with history of endometriosis (status post laparoscopy by prior gynecologist), DVT in 2017 (following foot surgery), anxiety and depression and migraine with aura, history of DVT here for follow-up of dyspareunia (primary concern) and AUB. Marriedx 30+yr. 2 daughters and 1 son.  At her last appointment, she noted: "Pain with IC x 1 year, using lube and pain mostly with insertion, however occasionally with deep penetration.  Abnormal bleeding x 1 year and half-  changing tampon once an hour, blood clotting for 7-8 days. Regular periods but now very heavy.  Last GYN care in 2015, however sees PCP for PAPs."  Her primary concern is her dyspareunia.  Patient's last menstrual period was 07/20/2023 (approximate).    Today, pt reports medication has been doing great. No issues or concerns. Pain only sometimes with intercourse if she misses the intrarosa . No cycle since March.  She is still getting monthly migraines around the time of her cycle.  Asking about migraine medications.  Last PAP:     Component Value Date/Time   DIAGPAP  06/24/2020 0000    - Negative for intraepithelial lesion or malignancy (NILM)   HPVHIGH Negative 06/24/2020 0000   ADEQPAP  06/24/2020 0000    Satisfactory for evaluation; transformation zone component PRESENT.   Birth control: BTL Last mammogram: November 2023 Sexually active: Yes  GYN HISTORY: Endometriosis status post prior laparoscopy  OB History  Gravida Para Term Preterm AB Living  3 3 3      SAB IAB Ectopic Multiple Live Births          # Outcome Date GA Lbr Len/2nd Weight Sex Type Anes PTL Lv  3 Term      Vag-Spont  Y   2 Term      Vag-Spont     1 Term      Vag-Spont  Y     Past Medical History:  Diagnosis Date   GAD (generalized anxiety disorder)    History of diverticulitis of colon    History of DVT of lower extremity 11/2015   per pt post op left bunion LLE dvt and completed blood thinner   History of  kidney stones    Left ureteral stone    MDD (major depressive disorder)    Mild obstructive sleep apnea    per study in epic 11-08-2015,  per pt no cpap recommendation   Nephrolithiasis    bilateral nonobstructive    Thoracic outlet syndrome    followed by dr branch [facility] -- intermittant symptoms, but no spine related and been referred for second opinion    Past Surgical History:  Procedure Laterality Date   BUNIONECTOMY Left 12/08/2015   CYSTOSCOPY/URETEROSCOPY/HOLMIUM LASER/STENT PLACEMENT Left 04/07/2020   Procedure: CYSTOSCOPY LEFT RETROGRADE PYELOGRAM URETEROSCOPY/HOLMIUM LASER/STENT PLACEMENT;  Surgeon: Andrez Banker, MD;  Location: [facility];  Service: Urology;  Laterality: Left;   LAPAROSCOPIC CHOLECYSTECTOMY  07/2015   LUMBAR DISC SURGERY  10/05/2016   L5-- S1   LUMBAR FUSION  01/24/2018   L5-- S1   NASAL SEPTUM SURGERY  2006   TUBAL LIGATION Bilateral 2004   URETEROSCOPY WITH HOLMIUM LASER LITHOTRIPSY  2013    Current Outpatient Medications on File Prior to Visit  Medication Sig Dispense Refill   albuterol  (VENTOLIN  HFA) 108 (90 Base) MCG/ACT inhaler Inhale into the lungs.     chlorthalidone (HYGROTON) 25 MG tablet      DULoxetine (CYMBALTA) 20 MG capsule Take 40 mg by mouth  daily.     fluticasone -salmeterol (ADVAIR) 500-50 MCG/ACT AEPB Inhale 1 puff into the lungs in the morning and at bedtime. 60 each 4   folic acid (FOLVITE) 1 MG tablet Take 1 mg by mouth 2 (two) times daily.     golimumab (SIMPONI ARIA) 50 MG/4ML SOLN injection      hydrOXYzine (ATARAX/VISTARIL) 25 MG tablet Take 25 mg by mouth 2 (two) times daily.     ibuprofen  (ADVIL ) 800 MG tablet Take 1 tablet (800 mg total) by mouth every 6 (six) hours as needed. 60 tablet 1   norethindrone  (AYGESTIN ) 5 MG tablet Take 0.5 tablets (2.5 mg total) by mouth daily. 45 tablet 3   potassium chloride (KLOR-CON) 10 MEQ tablet Take 10 mEq by mouth 2 (two) times daily.     Prasterone   (INTRAROSA ) 6.5 MG INST Place 1 suppository vaginally at bedtime. 90 each 3   predniSONE  (STERAPRED UNI-PAK 21 TAB) 5 MG (21) TBPK tablet SMARTSIG:- Tablet(s) By Mouth -     traZODone (DESYREL) 50 MG tablet Take 50 mg by mouth at bedtime.     No current facility-administered medications on file prior to visit.    Allergies  Allergen Reactions   Other     Other reaction(s): Other IVP dye  Unknown reaction IVP dye  Unknown reaction    Betadine [Povidone Iodine] Hives    blisters   Iodinated Contrast Media     Other reaction(s): Other (See Comments) IVP dye  Unknown reaction   Shellfish Allergy Hives    All shellfish   Topiramate     Other reaction(s): Other kidney stones kidney stones     PE Today's Vitals   09/18/23 0910  BP: 98/62  Pulse: 93  Temp: 98.7 F (37.1 C)  TempSrc: Oral  SpO2: 99%  Weight: 149 lb 9.6 oz (67.9 kg)   Body mass index is 27.14 kg/m.  Physical Exam Vitals reviewed.  Constitutional:      General: She is not in acute distress.    Appearance: Normal appearance.  HENT:     Head: Normocephalic and atraumatic.     Nose: Nose normal.  Eyes:     Extraocular Movements: Extraocular movements intact.     Conjunctiva/sclera: Conjunctivae normal.  Pulmonary:     Effort: Pulmonary effort is normal.  Musculoskeletal:        General: Normal range of motion.     Cervical back: Normal range of motion.  Neurological:     General: No focal deficit present.     Mental Status: She is alert.  Psychiatric:        Mood and Affect: Mood normal.        Behavior: Behavior normal.      Assessment and Plan:        Endometriosis determined by laparoscopy  Dyspareunia in female Assessment & Plan: Patient with history of endometriosis and perimenopausal.  Recommend multifaceted approach to dyspareunia. 06/27/23 started intrarosa  and Aygestin  for continuous hormonal therapy.  Also for improvement of bleeding profile. Recommend pelvic floor PT for  dyspareunia in the setting of endometriosis- Has not started due to familial obligations. Symptoms currently well controlled.  If symptoms worsen, recommend pelvic floor PT for 3 to 6 months or increasing aygestin  prior to surgical management.   Abnormal uterine bleeding (AUB) Assessment & Plan: Start Aygestin  06/27/2023 Bleeding has been well-controlled since (previously heavy) We will continue at this time   Migraine with aura and without status migrainosus, not intractable -  SUMAtriptan  Succinate; Take 1 tablet (50 mg total) by mouth once for 1 dose. May repeat in 2 hours if headache persists or recurs.  Dispense: 10 tablet; Refill: 3  Rx provided for menstrual migraine   Romaine Closs, MD

## 2023-09-18 NOTE — Assessment & Plan Note (Signed)
 Start Aygestin  06/27/2023 Bleeding has been well-controlled since (previously heavy) We will continue at this time

## 2023-09-18 NOTE — Assessment & Plan Note (Signed)
 Patient with history of endometriosis and perimenopausal.  Recommend multifaceted approach to dyspareunia. 06/27/23 started intrarosa  and Aygestin  for continuous hormonal therapy.  Also for improvement of bleeding profile. Recommend pelvic floor PT for dyspareunia in the setting of endometriosis- Has not started due to familial obligations. Symptoms currently well controlled.  If symptoms worsen, recommend pelvic floor PT for 3 to 6 months or increasing aygestin  prior to surgical management.

## 2023-12-25 ENCOUNTER — Other Ambulatory Visit: Payer: Self-pay | Admitting: Pulmonary Disease

## 2024-01-07 ENCOUNTER — Encounter: Payer: Self-pay | Admitting: Obstetrics and Gynecology

## 2024-01-30 ENCOUNTER — Other Ambulatory Visit

## 2024-01-30 DIAGNOSIS — Z006 Encounter for examination for normal comparison and control in clinical research program: Secondary | ICD-10-CM

## 2024-02-08 LAB — GENECONNECT MOLECULAR SCREEN: Genetic Analysis Overall Interpretation: NEGATIVE

## 2024-04-06 ENCOUNTER — Ambulatory Visit: Admitting: Pulmonary Disease

## 2024-04-07 ENCOUNTER — Ambulatory Visit: Admitting: Pulmonary Disease

## 2024-05-21 ENCOUNTER — Other Ambulatory Visit: Payer: Self-pay | Admitting: Obstetrics and Gynecology

## 2024-05-21 DIAGNOSIS — N941 Unspecified dyspareunia: Secondary | ICD-10-CM

## 2024-05-21 NOTE — Telephone Encounter (Signed)
 Med refill request:  Prasterone  (INTRAROSA ) 6.5 MG INST  Start:  08/01/23 Disp: 90 each Refills:  3  Last OV:  09/18/23 Next AEX:  06/30/24 Last MMG (if hormonal med):  06/19/21 Refill authorized? Please Advise.

## 2024-06-30 ENCOUNTER — Ambulatory Visit: Admitting: Obstetrics and Gynecology

## 2024-07-14 ENCOUNTER — Ambulatory Visit: Admitting: Pulmonary Disease
